# Patient Record
Sex: Female | Born: 1995 | Race: Black or African American | Hispanic: No | Smoking: Never smoker
Health system: Southern US, Community
[De-identification: ages and names within clinical notes are randomized; demographics above are authoritative.]

## PROBLEM LIST (undated history)

## (undated) DIAGNOSIS — I10 Essential (primary) hypertension: Secondary | ICD-10-CM

## (undated) DIAGNOSIS — T8859XA Other complications of anesthesia, initial encounter: Secondary | ICD-10-CM

## (undated) DIAGNOSIS — IMO0002 Reserved for concepts with insufficient information to code with codable children: Secondary | ICD-10-CM

## (undated) DIAGNOSIS — F32A Depression, unspecified: Secondary | ICD-10-CM

## (undated) DIAGNOSIS — R519 Headache, unspecified: Secondary | ICD-10-CM

## (undated) DIAGNOSIS — F419 Anxiety disorder, unspecified: Secondary | ICD-10-CM

## (undated) HISTORY — DX: Other complications of anesthesia, initial encounter: T88.59XA

## (undated) HISTORY — DX: Reserved for concepts with insufficient information to code with codable children: IMO0002

## (undated) HISTORY — PX: OTHER SURGICAL HISTORY: SHX169

---
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
## 2017-03-14 ENCOUNTER — Other Ambulatory Visit: Payer: Self-pay | Admitting: Obstetrics and Gynecology

## 2018-03-13 ENCOUNTER — Other Ambulatory Visit: Payer: Self-pay | Admitting: Obstetrics and Gynecology

## 2018-03-14 ENCOUNTER — Other Ambulatory Visit: Payer: Self-pay

## 2018-03-14 DIAGNOSIS — IMO0001 Reserved for inherently not codable concepts without codable children: Secondary | ICD-10-CM

## 2018-03-14 DIAGNOSIS — Z30011 Encounter for initial prescription of contraceptive pills: Secondary | ICD-10-CM

## 2018-03-14 MED ORDER — NORETHIN ACE-ETH ESTRAD-FE 1.5-30 MG-MCG PO TABS: 1.0000 | ORAL_TABLET | Freq: Every day | ORAL | 0 refills | Status: DC

## 2018-03-14 NOTE — Progress Notes (Signed)
Rx sent w/ no refills.   Pt advised to keep Annual Appt to get additional refills.  Pt voiced understanding.

## 2018-03-25 ENCOUNTER — Ambulatory Visit: Payer: Self-pay | Admitting: Family Medicine

## 2018-04-09 ENCOUNTER — Other Ambulatory Visit: Payer: Self-pay | Admitting: Obstetrics

## 2018-04-09 DIAGNOSIS — IMO0001 Reserved for inherently not codable concepts without codable children: Secondary | ICD-10-CM

## 2018-04-09 NOTE — Telephone Encounter (Signed)
Please review for refill.  

## 2018-04-21 ENCOUNTER — Other Ambulatory Visit (HOSPITAL_COMMUNITY)
Admission: RE | Admit: 2018-04-21 | Discharge: 2018-04-21 | Disposition: A | Discharge status: Home / Self Care | Payer: Medicaid Other | Source: Ambulatory Visit | Attending: Family Medicine | Admitting: Family Medicine

## 2018-04-21 ENCOUNTER — Ambulatory Visit (INDEPENDENT_AMBULATORY_CARE_PROVIDER_SITE_OTHER): Payer: Medicaid Other

## 2018-04-21 VITALS — BP 137/87 | HR 82 | Ht 65.0 in | Wt 149.8 lb

## 2018-04-21 DIAGNOSIS — IMO0001 Reserved for inherently not codable concepts without codable children: Secondary | ICD-10-CM

## 2018-04-21 DIAGNOSIS — N760 Acute vaginitis: Secondary | ICD-10-CM | POA: Diagnosis not present

## 2018-04-21 DIAGNOSIS — Z0000 Encounter for general adult medical examination without abnormal findings: Secondary | ICD-10-CM | POA: Diagnosis not present

## 2018-04-21 DIAGNOSIS — Z01419 Encounter for gynecological examination (general) (routine) without abnormal findings: Secondary | ICD-10-CM

## 2018-04-21 DIAGNOSIS — B373 Candidiasis of vulva and vagina: Secondary | ICD-10-CM | POA: Insufficient documentation

## 2018-04-21 MED ORDER — NORETHIN ACE-ETH ESTRAD-FE 1.5-30 MG-MCG PO TABS: 1.0000 | ORAL_TABLET | Freq: Every day | ORAL | 11 refills | Status: DC

## 2018-04-21 MED ORDER — FLUCONAZOLE 150 MG PO TABS: 150.0000 mg | ORAL_TABLET | Freq: Once | ORAL | 0 refills | Status: AC

## 2018-04-21 NOTE — Progress Notes (Signed)
Pt is here for annual gyn exam. G0. Currently sexually active, contraception: Junel FE. LMP: 04/09/18 approx. Pt states she thinks she might have a yeast infection, c/o discharge, itching and odor. Pt states this has been happening for a couple months, notices that it starts before her period. Pt has tried natural home remedies for these symptoms.
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
## 2018-04-22 LAB — CERVICOVAGINAL ANCILLARY ONLY
Bacterial vaginitis: NEGATIVE
Candida vaginitis: POSITIVE — AB
Chlamydia: POSITIVE — AB
Neisseria Gonorrhea: NEGATIVE
Trichomonas: NEGATIVE

## 2018-04-25 ENCOUNTER — Telehealth: Payer: Self-pay

## 2018-04-25 DIAGNOSIS — A749 Chlamydial infection, unspecified: Secondary | ICD-10-CM

## 2018-04-25 MED ORDER — AZITHROMYCIN 250 MG PO TABS: 1000.0000 mg | ORAL_TABLET | Freq: Once | ORAL | 0 refills | Status: AC

## 2018-04-25 NOTE — Telephone Encounter (Signed)
Attempted to call patient regarding positive chlamydia results. Left message requesting call back. Will send RX to pharmacy and send patient a myChart message.  Rolm Bookbinder, CNM 04/25/18 1:07 PM

## 2018-04-25 NOTE — Telephone Encounter (Signed)
Patient informed of diagnosis and RX at pharmacy. Recommended partner treatment at the [facility] department and abstaining from intercourse for 2 weeks. Patient verbalized understanding.  Rolm Bookbinder, CNM 04/25/18 1:14 PM

## 2018-08-29 ENCOUNTER — Ambulatory Visit: Payer: Medicaid Other | Admitting: Medical

## 2018-09-05 ENCOUNTER — Encounter: Payer: Self-pay | Admitting: Medical

## 2018-09-05 ENCOUNTER — Ambulatory Visit (INDEPENDENT_AMBULATORY_CARE_PROVIDER_SITE_OTHER): Payer: Medicaid Other | Admitting: Medical

## 2018-09-05 ENCOUNTER — Other Ambulatory Visit (HOSPITAL_COMMUNITY)
Admission: RE | Admit: 2018-09-05 | Discharge: 2018-09-05 | Disposition: A | Discharge status: Home / Self Care | Payer: Medicaid Other | Source: Ambulatory Visit | Attending: Medical | Admitting: Medical

## 2018-09-05 VITALS — BP 140/90 | HR 80 | Wt 156.0 lb

## 2018-09-05 DIAGNOSIS — N898 Other specified noninflammatory disorders of vagina: Secondary | ICD-10-CM | POA: Diagnosis not present

## 2018-09-05 DIAGNOSIS — B373 Candidiasis of vulva and vagina: Secondary | ICD-10-CM

## 2018-09-05 DIAGNOSIS — B3731 Acute candidiasis of vulva and vagina: Secondary | ICD-10-CM

## 2018-09-05 DIAGNOSIS — N760 Acute vaginitis: Secondary | ICD-10-CM

## 2018-09-05 DIAGNOSIS — B9689 Other specified bacterial agents as the cause of diseases classified elsewhere: Secondary | ICD-10-CM | POA: Diagnosis not present

## 2018-09-05 NOTE — Patient Instructions (Signed)
Vaginitis    Vaginitis is irritation and swelling (inflammation) of the vagina. It happens when normal bacteria and yeast in the vagina grow too much. There are many types of this condition. Treatment will depend on the type you have.  Follow these instructions at home:  Lifestyle  · Keep your vagina area clean and dry.  ? Avoid using soap.  ? Rinse the area with water.  · Do not do the following until your doctor says it is okay:  ? Wash and clean out the vagina (douche).  ? Use tampons.  ? Have sex.  · Wipe from front to back after going to the bathroom.  · Let air reach your vagina.  ? Wear cotton underwear.  ? Do not wear:  ? Underwear while you sleep.  ? Tight pants.  ? Thong underwear.  ? Underwear or nylons without a cotton panel.  ? Take off any wet clothing, such as bathing suits, as soon as possible.  · Use gentle, non-scented products. Do not use things that can irritate the vagina, such as fabric softeners. Avoid the following products if they are scented:  ? Feminine sprays.  ? Detergents.  ? Tampons.  ? Feminine hygiene products.  ? Soaps or bubble baths.  · Practice safe sex and use condoms.  General instructions  · Take over-the-counter and prescription medicines only as told by your doctor.  · If you were prescribed an antibiotic medicine, take or use it as told by your doctor. Do not stop taking or using the antibiotic even if you start to feel better.  · Keep all follow-up visits as told by your doctor. This is important.  Contact a doctor if:  · You have pain in your belly.  · You have a fever.  · Your symptoms last for more than 2-3 days.  Get help right away if:  · You have a fever and your symptoms get worse all of a sudden.  Summary  · Vaginitis is irritation and swelling of the vagina. It can happen when the normal bacteria and yeast in the vagina grow too much. There are many types.  · Treatment will depend on the type you have.  · Do not douche, use tampons , or have sex until your health  care provider approves. When you can return to sex, practice safe sex and use condoms.  This information is not intended to replace advice given to you by your health care provider. Make sure you discuss any questions you have with your health care provider.  Document Released: 10/12/2008 Document Revised: 08/07/2016 Document Reviewed: 08/07/2016  Elsevier Interactive Patient Education © 2019 [employer]

## 2018-09-05 NOTE — Progress Notes (Signed)
  History:  [name] is a 23 y.o. G0P0000 who presents to clinic today for vaginal odor x 2 months. She is on OCPs for birth control and uses condoms sometimes. She was seen for this in the past and had Chlamydia which was treated at her last visit. She denies abdominal pain, vaginal bleeding, abnormal discharge, fever today. She missed a couple of periods recently but had a period about a week ago. She denies missing any OCPs recently.    The following portions of the patient's history were reviewed and updated as appropriate: allergies, current medications, family history, past medical history, social history, past surgical history and problem list.  Review of Systems:  Review of Systems  Constitutional: Negative for fever.  Gastrointestinal: Positive for nausea and vomiting. Negative for abdominal pain.  Genitourinary:       Neg - discharge, bleeding      Objective:  Physical Exam BP 140/90   Pulse 80   Wt 156 lb (70.8 kg)   LMP 08/22/2018   BMI 25.96 kg/m  Physical Exam  Nursing note and vitals reviewed. Constitutional: She is oriented to person, place, and time. She appears well-developed and well-nourished. No distress.  HENT:  Head: Normocephalic and atraumatic.  Cardiovascular: Normal rate.  Respiratory: Effort normal.  GI: Soft. She exhibits no distension and no mass. There is no abdominal tenderness. There is no rebound and no guarding.  Genitourinary: Uterus is not enlarged and not tender. Cervix exhibits no motion tenderness, no discharge and no friability. Right adnexum displays no mass and no tenderness. Left adnexum displays no mass and no tenderness.    Vaginal discharge (scant white) present.     No vaginal bleeding.  No bleeding in the vagina.  Neurological: She is alert and oriented to person, place, and time.  Skin: Skin is warm and dry. No erythema.  Psychiatric: She has a normal mood and affect.     Assessment & Plan:  1. Vaginal odor -  Cervicovaginal ancillary only( )  2. Irregular periods - UPT today - negative   Marny Lowenstein, PA-C 09/05/2018 9:11 AM

## 2018-09-07 LAB — CERVICOVAGINAL ANCILLARY ONLY
Bacterial vaginitis: POSITIVE — AB
Candida vaginitis: POSITIVE — AB
Chlamydia: NEGATIVE
NEISSERIA GONORRHEA: NEGATIVE
Trichomonas: NEGATIVE

## 2018-09-08 MED ORDER — METRONIDAZOLE 500 MG PO TABS: 500.0000 mg | ORAL_TABLET | Freq: Two times a day (BID) | ORAL | 0 refills | Status: DC

## 2018-09-08 MED ORDER — FLUCONAZOLE 150 MG PO TABS: 150.0000 mg | ORAL_TABLET | Freq: Once | ORAL | 0 refills | Status: AC

## 2018-09-08 NOTE — Addendum Note (Signed)
Addended by: Marny Lowenstein on: 09/08/2018 12:54 PM   Modules accepted: Orders

## 2018-10-13 ENCOUNTER — Other Ambulatory Visit: Payer: Self-pay

## 2018-10-13 ENCOUNTER — Emergency Department (HOSPITAL_COMMUNITY)
Admission: EM | Admit: 2018-10-13 | Discharge: 2018-10-14 | Disposition: A | Discharge status: Home / Self Care | Payer: Medicaid Other | Attending: Emergency Medicine | Admitting: Emergency Medicine

## 2018-10-13 DIAGNOSIS — R5383 Other fatigue: Secondary | ICD-10-CM | POA: Diagnosis not present

## 2018-10-13 DIAGNOSIS — R0602 Shortness of breath: Secondary | ICD-10-CM | POA: Insufficient documentation

## 2018-10-13 DIAGNOSIS — I10 Essential (primary) hypertension: Secondary | ICD-10-CM | POA: Diagnosis not present

## 2018-10-13 DIAGNOSIS — Z79899 Other long term (current) drug therapy: Secondary | ICD-10-CM | POA: Diagnosis not present

## 2018-10-13 NOTE — ED Triage Notes (Signed)
Pt reports she was at work earlier and felt SOB and her eyes "felt heavy" Pt has no complaints at this time. No sick contacts, no recent travel, no fever.
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
## 2018-11-25 ENCOUNTER — Other Ambulatory Visit: Payer: Self-pay

## 2018-11-25 ENCOUNTER — Encounter (HOSPITAL_COMMUNITY): Payer: Self-pay | Admitting: *Deleted

## 2018-11-25 ENCOUNTER — Emergency Department (HOSPITAL_COMMUNITY)
Admission: EM | Admit: 2018-11-25 | Discharge: 2018-11-26 | Disposition: A | Discharge status: Home / Self Care | Payer: Medicaid Other | Attending: Emergency Medicine | Admitting: Emergency Medicine

## 2018-11-25 DIAGNOSIS — S6992XA Unspecified injury of left wrist, hand and finger(s), initial encounter: Secondary | ICD-10-CM | POA: Diagnosis present

## 2018-11-25 DIAGNOSIS — T23222A Burn of second degree of single left finger (nail) except thumb, initial encounter: Secondary | ICD-10-CM | POA: Diagnosis not present

## 2018-11-25 DIAGNOSIS — Z79899 Other long term (current) drug therapy: Secondary | ICD-10-CM | POA: Insufficient documentation

## 2018-11-25 DIAGNOSIS — T300 Burn of unspecified body region, unspecified degree: Secondary | ICD-10-CM

## 2018-11-25 DIAGNOSIS — X153XXA Contact with hot saucepan or skillet, initial encounter: Secondary | ICD-10-CM | POA: Diagnosis not present

## 2018-11-25 DIAGNOSIS — Y93G9 Activity, other involving cooking and grilling: Secondary | ICD-10-CM | POA: Insufficient documentation

## 2018-11-25 DIAGNOSIS — Y92000 Kitchen of unspecified non-institutional (private) residence as  the place of occurrence of the external cause: Secondary | ICD-10-CM | POA: Insufficient documentation

## 2018-11-25 DIAGNOSIS — I10 Essential (primary) hypertension: Secondary | ICD-10-CM | POA: Insufficient documentation

## 2018-11-25 DIAGNOSIS — Y999 Unspecified external cause status: Secondary | ICD-10-CM | POA: Insufficient documentation

## 2018-11-25 DIAGNOSIS — T23122A Burn of first degree of single left finger (nail) except thumb, initial encounter: Secondary | ICD-10-CM | POA: Diagnosis not present

## 2018-11-25 MED ORDER — SILVER SULFADIAZINE 1 % EX CREA
TOPICAL_CREAM | Freq: Once | CUTANEOUS | Status: AC
  Administered 2018-11-26: via TOPICAL
  Filled 2018-11-25: qty 85
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
## 2018-11-26 NOTE — Discharge Instructions (Signed)
Continue using silvadene cream and dressing changes at least once daily. Monitor for signs of infection--- pus, fever, severe swelling, etc. Return here for new concerns.

## 2019-03-31 ENCOUNTER — Other Ambulatory Visit: Payer: Self-pay

## 2019-03-31 ENCOUNTER — Encounter (HOSPITAL_COMMUNITY): Payer: Self-pay

## 2019-03-31 ENCOUNTER — Ambulatory Visit (HOSPITAL_COMMUNITY)
Admission: EM | Admit: 2019-03-31 | Discharge: 2019-03-31 | Disposition: A | Discharge status: Home / Self Care | Payer: Medicaid Other | Attending: Family Medicine | Admitting: Family Medicine

## 2019-03-31 DIAGNOSIS — M546 Pain in thoracic spine: Secondary | ICD-10-CM

## 2019-03-31 MED ORDER — CYCLOBENZAPRINE HCL 5 MG PO TABS: 5.0000 mg | ORAL_TABLET | Freq: Two times a day (BID) | ORAL | 0 refills | Status: DC | PRN

## 2019-03-31 MED ORDER — IBUPROFEN 800 MG PO TABS: 800.0000 mg | ORAL_TABLET | Freq: Three times a day (TID) | ORAL | 0 refills | Status: DC

## 2019-03-31 NOTE — Discharge Instructions (Signed)
Use anti-inflammatories for pain/swelling. You may take up to 800 mg Ibuprofen every 8 hours with food. You may supplement Ibuprofen with Tylenol [phone] mg every 8 hours.   You may use flexeril as needed to help with pain. This is a muscle relaxer and causes sedation- please use only at bedtime or when you will be home and not have to drive/work  Alternate ice and heat  Follow up if not getting better
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
## 2019-03-31 NOTE — ED Triage Notes (Signed)
Patient presents to Urgent Care with complaints of left sided back/shoulder pain since 4 days ago. Patient reports she was lifting something heavy and thinks she pulled a muscle. Pt states the pain is worse w/ movement or deep breaths.

## 2019-04-09 ENCOUNTER — Other Ambulatory Visit: Payer: Self-pay

## 2019-04-09 MED ORDER — NORETHIN ACE-ETH ESTRAD-FE 1.5-30 MG-MCG PO TABS: 1.0000 | ORAL_TABLET | Freq: Every day | ORAL | 0 refills | Status: DC

## 2019-05-06 ENCOUNTER — Other Ambulatory Visit: Payer: Self-pay | Admitting: Obstetrics

## 2019-05-27 ENCOUNTER — Telehealth: Payer: Self-pay

## 2019-05-27 NOTE — Telephone Encounter (Signed)
Attempted to return call twice, number busy/disconnected.

## 2019-05-28 ENCOUNTER — Telehealth: Payer: Self-pay

## 2019-05-28 ENCOUNTER — Other Ambulatory Visit: Payer: Self-pay | Admitting: Obstetrics

## 2019-05-28 MED ORDER — NORETHIN ACE-ETH ESTRAD-FE 1.5-30 MG-MCG PO TABS: 1.0000 | ORAL_TABLET | Freq: Every day | ORAL | 0 refills | Status: DC

## 2019-05-28 NOTE — Telephone Encounter (Signed)
Attempted to return call again, call will not go thru, sent requested BC refill, annual scheduled for 06-09-19

## 2019-06-09 ENCOUNTER — Other Ambulatory Visit (HOSPITAL_COMMUNITY)
Admission: RE | Admit: 2019-06-09 | Discharge: 2019-06-09 | Disposition: A | Discharge status: Home / Self Care | Payer: Medicaid Other | Source: Ambulatory Visit | Attending: Advanced Practice Midwife | Admitting: Advanced Practice Midwife

## 2019-06-09 ENCOUNTER — Encounter: Payer: Self-pay | Admitting: Advanced Practice Midwife

## 2019-06-09 ENCOUNTER — Ambulatory Visit (INDEPENDENT_AMBULATORY_CARE_PROVIDER_SITE_OTHER): Payer: Medicaid Other | Admitting: Advanced Practice Midwife

## 2019-06-09 ENCOUNTER — Other Ambulatory Visit: Payer: Self-pay

## 2019-06-09 VITALS — BP 128/86 | HR 97 | Ht 65.0 in | Wt 145.0 lb

## 2019-06-09 DIAGNOSIS — Z3009 Encounter for other general counseling and advice on contraception: Secondary | ICD-10-CM

## 2019-06-09 DIAGNOSIS — N898 Other specified noninflammatory disorders of vagina: Secondary | ICD-10-CM | POA: Insufficient documentation

## 2019-06-09 DIAGNOSIS — Z0000 Encounter for general adult medical examination without abnormal findings: Secondary | ICD-10-CM

## 2019-06-09 DIAGNOSIS — Z01419 Encounter for gynecological examination (general) (routine) without abnormal findings: Secondary | ICD-10-CM

## 2019-06-09 DIAGNOSIS — Z3041 Encounter for surveillance of contraceptive pills: Secondary | ICD-10-CM

## 2019-06-09 MED ORDER — NORETHIN ACE-ETH ESTRAD-FE 1.5-30 MG-MCG PO TABS: 1.0000 | ORAL_TABLET | Freq: Every day | ORAL | 4 refills | Status: DC

## 2019-06-09 NOTE — Progress Notes (Signed)
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  She is taking Junel OTC.  C/o of NV on first day of starting a new pack, vaginal discharge with odor x 1 week.  Got the FLU vaccine at work.  GAD-7=9

## 2019-06-09 NOTE — Progress Notes (Signed)
Subjective:     Anita Hoover is a 23 y.o. female here for a routine exam.  Current complaints: vaginal discharge with odor associated with her period and some mood changes she thinks may be related to OCPs.  Personal health questionnaire reviewed: yes.  Do you have a primary care provider? yes How many times per week do you exercise? 3-4 Do you feel safe at home? yes Has anyone hit, slapped, or kicked you recently? no Do you feel sad, tired, or upset most days or are you mostly happy with life? Mostly happy but some recent feelings of anxiety    Gynecologic History Patient's last menstrual period was 06/02/2019 (approximate). Contraception: OCP (estrogen/progesterone) Last Pap: n/a.  Last mammogram: n/a  Obstetric History OB History  Gravida Para Term Preterm AB Living  0 0 0 0 0 0  SAB TAB Ectopic Multiple Live Births  0 0 0 0 0     The following portions of the patient's history were reviewed and updated as appropriate: allergies, current medications, past family history, past medical history, past social history, past surgical history and problem list.  Review of Systems Pertinent items noted in HPI and remainder of comprehensive ROS otherwise negative.    Objective:   BP 128/86 (BP Location: Left Arm, Cuff Size: Normal)   Pulse 97   Ht 5\' 5"  (1.651 m)   Wt 145 lb (65.8 kg)   LMP 06/02/2019 (Approximate)   BMI 24.13 kg/m   VS reviewed, nursing note reviewed,  Constitutional: well developed, well nourished, no distress HEENT: normocephalic, thyroid wnl CV: normal rate Pulm/chest wall: normal effort Breast Exam:  right breast normal without mass, skin or nipple changes or axillary nodes, left breast normal without mass, skin or nipple changes or axillary nodes Abdomen: soft Neuro: alert and oriented x 3 Skin: warm, dry Psych: affect normal Pelvic exam: No SSE, vaginal swab collected by blind swab, vaginal walls and external genitalia normal       Assessment/Plan:   1. Vaginal discharge --Pt reports thin discharge with odor, associated with menses.  Likely BV but will screen for STDs/yeast as well. - Cervicovaginal ancillary only( [facility])  2. Well woman exam with routine gynecological exam --Doing well    3. Encounter for counseling regarding contraception --Discussed LARCs as most effective forms of birth control.  Discussed benefits/risks of other methods.   --Also discussed feelings of anxiety, may or may not be related to OCPs.  Discussed options including trying progesterone only contraception or switching to another brand of OCP.  Pt considering stopping OCPs for short course to see if symptoms improve.  Recommend condom use.   --Pt with borderline BP today, hx of HTN treated with medications but PCP took her off medications as BP normalized.  No barriers to OCPs but pt encouraged to see PCP/check BP while on OCPs. --Pt desires to continue Junel OCPs at this time.  Will call office if desires to change. Pt given written and verbal information about other contraceptive choices.   - norethindrone-ethinyl estradiol-iron (JUNEL FE 1.5/30) 1.5-30 MG-MCG tablet; Take 1 tablet by mouth daily. May change to 28 tablets with 11 refills if 90 day supply not covered by pt insurance  Dispense: 84 tablet; Refill: 4    Follow up in: 1 year or as needed.   Fatima Blank, CNM 9:36 AM

## 2019-06-11 LAB — CERVICOVAGINAL ANCILLARY ONLY
Bacterial Vaginitis (gardnerella): POSITIVE — AB
Candida Glabrata: NEGATIVE
Candida Vaginitis: NEGATIVE
Chlamydia: NEGATIVE
Comment: NEGATIVE
Comment: NEGATIVE
Comment: NEGATIVE
Comment: NEGATIVE
Comment: NEGATIVE
Comment: NORMAL
Neisseria Gonorrhea: NEGATIVE
Trichomonas: NEGATIVE

## 2019-06-16 MED ORDER — METRONIDAZOLE 500 MG PO TABS: 500.0000 mg | ORAL_TABLET | Freq: Two times a day (BID) | ORAL | 0 refills | Status: AC

## 2019-06-16 NOTE — Addendum Note (Signed)
Addended by: Fatima Blank A on: 06/16/2019 02:06 PM   Modules accepted: Orders

## 2019-09-07 ENCOUNTER — Other Ambulatory Visit: Payer: Self-pay | Admitting: *Deleted

## 2019-09-07 DIAGNOSIS — N898 Other specified noninflammatory disorders of vagina: Secondary | ICD-10-CM

## 2019-09-07 MED ORDER — FLUCONAZOLE 150 MG PO TABS: 150.0000 mg | ORAL_TABLET | Freq: Once | ORAL | 0 refills | Status: AC

## 2019-09-07 NOTE — Progress Notes (Signed)
Pt called to office with vaginal itching- yeast inf- ask for tx. Diflucan sent in per protocol. Advised to call office if symptoms do not resolve.

## 2019-11-03 ENCOUNTER — Other Ambulatory Visit: Payer: Self-pay

## 2019-11-03 ENCOUNTER — Encounter (HOSPITAL_COMMUNITY): Payer: Self-pay | Admitting: Emergency Medicine

## 2019-11-03 ENCOUNTER — Emergency Department (HOSPITAL_COMMUNITY)
Admission: EM | Admit: 2019-11-03 | Discharge: 2019-11-03 | Disposition: A | Discharge status: Home / Self Care | Payer: Medicaid Other | Attending: Emergency Medicine | Admitting: Emergency Medicine

## 2019-11-03 DIAGNOSIS — I10 Essential (primary) hypertension: Secondary | ICD-10-CM | POA: Insufficient documentation

## 2019-11-03 DIAGNOSIS — F419 Anxiety disorder, unspecified: Secondary | ICD-10-CM | POA: Insufficient documentation

## 2019-11-03 DIAGNOSIS — R0602 Shortness of breath: Secondary | ICD-10-CM | POA: Diagnosis present

## 2019-11-03 MED ORDER — HYDROXYZINE HCL 25 MG PO TABS: 25.0000 mg | ORAL_TABLET | Freq: Four times a day (QID) | ORAL | 0 refills | Status: DC

## 2019-11-03 NOTE — Discharge Instructions (Signed)
As discussed, I have included outpatient resources for anxiety. I recommend calling today to schedule appointments. I am also sending you home with anxiety medication to take as needed. I have included the [facility] for [facility]. I also recommend calling to schedule an appointment to establish care. Please return to the ER for new or worsening symptoms or if you feel like you want to hurt yourself.

## 2019-11-03 NOTE — ED Triage Notes (Signed)
Pt arrives to ED from work with complaints of an anxiety attack this morning and feeling depressed for over three years. Patient states that within the past year her depression has worsened. No SI/HI thoughts today. Patient states nothing changed today she just feels like she needs help.
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
## 2019-11-07 DIAGNOSIS — F419 Anxiety disorder, unspecified: Secondary | ICD-10-CM | POA: Diagnosis not present

## 2019-11-11 ENCOUNTER — Encounter (HOSPITAL_COMMUNITY): Payer: Self-pay | Admitting: Psychiatry

## 2019-11-11 ENCOUNTER — Ambulatory Visit (INDEPENDENT_AMBULATORY_CARE_PROVIDER_SITE_OTHER): Payer: Medicaid Other | Admitting: Psychiatry

## 2019-11-11 ENCOUNTER — Other Ambulatory Visit: Payer: Self-pay

## 2019-11-11 DIAGNOSIS — F411 Generalized anxiety disorder: Secondary | ICD-10-CM

## 2019-11-11 DIAGNOSIS — F410 Panic disorder [episodic paroxysmal anxiety] without agoraphobia: Secondary | ICD-10-CM

## 2019-11-11 DIAGNOSIS — F330 Major depressive disorder, recurrent, mild: Secondary | ICD-10-CM | POA: Insufficient documentation

## 2019-11-11 DIAGNOSIS — F331 Major depressive disorder, recurrent, moderate: Secondary | ICD-10-CM | POA: Diagnosis not present

## 2019-11-11 MED ORDER — FLUOXETINE HCL 20 MG PO CAPS: 20.0000 mg | ORAL_CAPSULE | Freq: Every day | ORAL | 0 refills | Status: DC

## 2019-11-11 MED ORDER — TRAZODONE HCL 50 MG PO TABS: 50.0000 mg | ORAL_TABLET | Freq: Every evening | ORAL | 0 refills | Status: DC | PRN

## 2019-11-11 MED ORDER — LORAZEPAM 1 MG PO TABS: 1.0000 mg | ORAL_TABLET | Freq: Every day | ORAL | 0 refills | Status: DC | PRN
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
## 2019-12-09 ENCOUNTER — Telehealth (HOSPITAL_COMMUNITY): Payer: Medicaid Other | Admitting: Psychiatry

## 2019-12-11 ENCOUNTER — Telehealth (INDEPENDENT_AMBULATORY_CARE_PROVIDER_SITE_OTHER): Payer: Medicaid Other | Admitting: Psychiatry

## 2019-12-11 ENCOUNTER — Other Ambulatory Visit: Payer: Self-pay

## 2019-12-11 DIAGNOSIS — F331 Major depressive disorder, recurrent, moderate: Secondary | ICD-10-CM

## 2019-12-11 DIAGNOSIS — F411 Generalized anxiety disorder: Secondary | ICD-10-CM

## 2019-12-11 DIAGNOSIS — F410 Panic disorder [episodic paroxysmal anxiety] without agoraphobia: Secondary | ICD-10-CM | POA: Diagnosis not present

## 2019-12-11 MED ORDER — LORAZEPAM 1 MG PO TABS: 1.0000 mg | ORAL_TABLET | Freq: Every day | ORAL | 0 refills | Status: AC | PRN

## 2019-12-11 MED ORDER — FLUOXETINE HCL 20 MG PO CAPS: 20.0000 mg | ORAL_CAPSULE | Freq: Every day | ORAL | 0 refills | Status: DC
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
## 2020-01-28 ENCOUNTER — Telehealth (HOSPITAL_COMMUNITY): Payer: Medicaid Other | Admitting: Psychiatry

## 2020-02-03 ENCOUNTER — Other Ambulatory Visit: Payer: Self-pay

## 2020-02-03 ENCOUNTER — Telehealth (INDEPENDENT_AMBULATORY_CARE_PROVIDER_SITE_OTHER): Payer: Medicaid Other | Admitting: Psychiatry

## 2020-02-03 DIAGNOSIS — F410 Panic disorder [episodic paroxysmal anxiety] without agoraphobia: Secondary | ICD-10-CM

## 2020-02-03 MED ORDER — LORAZEPAM 1 MG PO TABS: 1.0000 mg | ORAL_TABLET | Freq: Every day | ORAL | 2 refills | Status: DC | PRN
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
## 2020-02-12 ENCOUNTER — Ambulatory Visit: Payer: Medicaid Other

## 2020-02-16 ENCOUNTER — Ambulatory Visit: Payer: Medicaid Other | Admitting: Obstetrics

## 2020-04-11 ENCOUNTER — Telehealth (INDEPENDENT_AMBULATORY_CARE_PROVIDER_SITE_OTHER): Payer: Medicaid Other | Admitting: Psychiatry

## 2020-04-11 ENCOUNTER — Other Ambulatory Visit: Payer: Self-pay

## 2020-04-11 DIAGNOSIS — F410 Panic disorder [episodic paroxysmal anxiety] without agoraphobia: Secondary | ICD-10-CM | POA: Diagnosis not present

## 2020-04-11 DIAGNOSIS — F330 Major depressive disorder, recurrent, mild: Secondary | ICD-10-CM | POA: Diagnosis not present

## 2020-04-11 DIAGNOSIS — F411 Generalized anxiety disorder: Secondary | ICD-10-CM | POA: Diagnosis not present

## 2020-04-11 MED ORDER — CLONAZEPAM 0.5 MG PO TABS: 0.5000 mg | ORAL_TABLET | Freq: Three times a day (TID) | ORAL | 0 refills | Status: DC | PRN
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
## 2020-04-19 DIAGNOSIS — Z114 Encounter for screening for human immunodeficiency virus [HIV]: Secondary | ICD-10-CM | POA: Diagnosis not present

## 2020-04-19 DIAGNOSIS — Z0000 Encounter for general adult medical examination without abnormal findings: Secondary | ICD-10-CM | POA: Diagnosis not present

## 2020-04-19 DIAGNOSIS — R87610 Atypical squamous cells of undetermined significance on cytologic smear of cervix (ASC-US): Secondary | ICD-10-CM | POA: Diagnosis not present

## 2020-04-19 DIAGNOSIS — Z13220 Encounter for screening for lipoid disorders: Secondary | ICD-10-CM | POA: Diagnosis not present

## 2020-04-19 DIAGNOSIS — R0602 Shortness of breath: Secondary | ICD-10-CM | POA: Diagnosis not present

## 2020-04-19 DIAGNOSIS — Z20822 Contact with and (suspected) exposure to covid-19: Secondary | ICD-10-CM | POA: Diagnosis not present

## 2020-04-19 DIAGNOSIS — E559 Vitamin D deficiency, unspecified: Secondary | ICD-10-CM | POA: Diagnosis not present

## 2020-04-19 DIAGNOSIS — N915 Oligomenorrhea, unspecified: Secondary | ICD-10-CM | POA: Diagnosis not present

## 2020-04-19 DIAGNOSIS — Z13228 Encounter for screening for other metabolic disorders: Secondary | ICD-10-CM | POA: Diagnosis not present

## 2020-04-19 DIAGNOSIS — Z131 Encounter for screening for diabetes mellitus: Secondary | ICD-10-CM | POA: Diagnosis not present

## 2020-04-19 DIAGNOSIS — Z1159 Encounter for screening for other viral diseases: Secondary | ICD-10-CM | POA: Diagnosis not present

## 2020-04-19 DIAGNOSIS — Z7251 High risk heterosexual behavior: Secondary | ICD-10-CM | POA: Diagnosis not present

## 2020-04-19 DIAGNOSIS — R634 Abnormal weight loss: Secondary | ICD-10-CM | POA: Diagnosis not present

## 2020-04-20 ENCOUNTER — Telehealth: Payer: Self-pay

## 2020-04-20 NOTE — Telephone Encounter (Signed)
Pt called and reports that she wants to stop taking Junel birth control pills. Pt wanted to know if she needed to do anything specific. I advised pt to wait until the end of the pack to discontinue so she can keep her menstrual cycle on track. Pt voices understanding. Advised patient to use condoms or other form of birth control right away after stopping pills unless she wants to become pregnant, pt voices understanding.

## 2020-04-26 DIAGNOSIS — Z0000 Encounter for general adult medical examination without abnormal findings: Secondary | ICD-10-CM | POA: Diagnosis not present

## 2020-04-26 DIAGNOSIS — Z1159 Encounter for screening for other viral diseases: Secondary | ICD-10-CM | POA: Diagnosis not present

## 2020-04-26 DIAGNOSIS — Z7251 High risk heterosexual behavior: Secondary | ICD-10-CM | POA: Diagnosis not present

## 2020-05-04 ENCOUNTER — Other Ambulatory Visit: Payer: Self-pay

## 2020-05-04 ENCOUNTER — Telehealth (INDEPENDENT_AMBULATORY_CARE_PROVIDER_SITE_OTHER): Payer: Medicaid Other | Admitting: Psychiatry

## 2020-05-04 DIAGNOSIS — Z3041 Encounter for surveillance of contraceptive pills: Secondary | ICD-10-CM | POA: Diagnosis not present

## 2020-05-04 DIAGNOSIS — F411 Generalized anxiety disorder: Secondary | ICD-10-CM | POA: Diagnosis not present

## 2020-05-04 MED ORDER — TRAZODONE HCL 50 MG PO TABS: 50.0000 mg | ORAL_TABLET | Freq: Every evening | ORAL | 1 refills | Status: DC | PRN

## 2020-05-04 MED ORDER — FLUOXETINE HCL 20 MG PO CAPS: 20.0000 mg | ORAL_CAPSULE | Freq: Every day | ORAL | 2 refills | Status: DC

## 2020-05-04 MED ORDER — CLONAZEPAM 0.5 MG PO TABS: 0.5000 mg | ORAL_TABLET | Freq: Three times a day (TID) | ORAL | 1 refills | Status: DC | PRN
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
## 2020-05-06 DIAGNOSIS — Z3009 Encounter for other general counseling and advice on contraception: Secondary | ICD-10-CM | POA: Diagnosis not present

## 2020-05-06 DIAGNOSIS — R0602 Shortness of breath: Secondary | ICD-10-CM | POA: Diagnosis not present

## 2020-05-06 DIAGNOSIS — E559 Vitamin D deficiency, unspecified: Secondary | ICD-10-CM | POA: Diagnosis not present

## 2020-05-06 DIAGNOSIS — R634 Abnormal weight loss: Secondary | ICD-10-CM | POA: Diagnosis not present

## 2020-06-06 DIAGNOSIS — U071 COVID-19: Secondary | ICD-10-CM | POA: Diagnosis not present

## 2020-06-09 DIAGNOSIS — U071 COVID-19: Secondary | ICD-10-CM | POA: Diagnosis not present

## 2020-06-09 DIAGNOSIS — Z9189 Other specified personal risk factors, not elsewhere classified: Secondary | ICD-10-CM | POA: Diagnosis not present

## 2020-06-09 DIAGNOSIS — R110 Nausea: Secondary | ICD-10-CM | POA: Diagnosis not present

## 2020-06-13 DIAGNOSIS — U071 COVID-19: Secondary | ICD-10-CM | POA: Diagnosis not present

## 2020-06-13 DIAGNOSIS — Z9189 Other specified personal risk factors, not elsewhere classified: Secondary | ICD-10-CM | POA: Diagnosis not present

## 2020-06-14 ENCOUNTER — Emergency Department (HOSPITAL_COMMUNITY)
Admission: EM | Admit: 2020-06-14 | Discharge: 2020-06-14 | Disposition: A | Discharge status: Home / Self Care | Payer: Medicaid Other | Attending: Emergency Medicine | Admitting: Emergency Medicine

## 2020-06-14 ENCOUNTER — Encounter (HOSPITAL_COMMUNITY): Payer: Self-pay

## 2020-06-14 ENCOUNTER — Other Ambulatory Visit: Payer: Self-pay

## 2020-06-14 DIAGNOSIS — I10 Essential (primary) hypertension: Secondary | ICD-10-CM | POA: Diagnosis not present

## 2020-06-14 DIAGNOSIS — R112 Nausea with vomiting, unspecified: Secondary | ICD-10-CM

## 2020-06-14 DIAGNOSIS — U071 COVID-19: Secondary | ICD-10-CM | POA: Insufficient documentation

## 2020-06-14 DIAGNOSIS — R0981 Nasal congestion: Secondary | ICD-10-CM | POA: Diagnosis present

## 2020-06-14 LAB — COMPREHENSIVE METABOLIC PANEL
ALT: 28 U/L (ref 0–44)
AST: 23 U/L (ref 15–41)
Albumin: 4.3 g/dL (ref 3.5–5.0)
Alkaline Phosphatase: 50 U/L (ref 38–126)
Anion gap: 8 (ref 5–15)
BUN: 10 mg/dL (ref 6–20)
CO2: 28 mmol/L (ref 22–32)
Calcium: 9.4 mg/dL (ref 8.9–10.3)
Chloride: 105 mmol/L (ref 98–111)
Creatinine, Ser: 0.69 mg/dL (ref 0.44–1.00)
GFR, Estimated: >60 mL/min (ref >60)
Glucose, Bld: 99 mg/dL (ref 70–99)
Potassium: 4 mmol/L (ref 3.5–5.1)
Sodium: 141 mmol/L (ref 135–145)
Total Bilirubin: 0.6 mg/dL (ref 0.3–1.2)
Total Protein: 7.3 g/dL (ref 6.5–8.1)

## 2020-06-14 LAB — CBC
HCT: 42.9 % (ref 36.0–46.0)
Hemoglobin: 14.1 g/dL (ref 12.0–15.0)
MCH: 30.5 pg (ref 26.0–34.0)
MCHC: 32.9 g/dL (ref 30.0–36.0)
MCV: 92.7 fL (ref 80.0–100.0)
Platelets: 328 10*3/uL (ref 150–400)
RBC: 4.63 MIL/uL (ref 3.87–5.11)
RDW: 12.6 % (ref 11.5–15.5)
WBC: 6.4 10*3/uL (ref 4.0–10.5)
nRBC: 0 % (ref 0.0–0.2)

## 2020-06-14 LAB — RESPIRATORY PANEL BY RT PCR (FLU A&B, COVID)
Influenza A by PCR: NEGATIVE
Influenza B by PCR: NEGATIVE
SARS Coronavirus 2 by RT PCR: POSITIVE — AB

## 2020-06-14 LAB — I-STAT BETA HCG BLOOD, ED (MC, WL, AP ONLY): I-stat hCG, quantitative: <5 m[IU]/mL (ref <5)

## 2020-06-14 LAB — LIPASE, BLOOD: Lipase: 32 U/L (ref 11–51)

## 2020-06-14 NOTE — ED Triage Notes (Signed)
Pt dx with covid 1-2 wks ago. Since then pt has had difficulty eating and drinking due to N/V, and some dizziness. Pt prescribed nausea medication, which helped little. Pt was able to eat last night and keep it down. Today pt had a telehealth appt with PCP, during which the provider recommended pt come to the emergency department to have her blood work checked due to the ongoing N/V.

## 2020-06-14 NOTE — Discharge Instructions (Addendum)
Continue frequent small sips (10-20 ml) of clear liquids every 5-10 minutes. Gatorade or powerade are good options. Avoid milk, orange juice, and grape juice for now. . Once you have not had further vomiting with the small sips for 4 hours, you may begin to drink larger volumes of fluids at a time and try a bland diet which may include saltine crackers, applesauce, breads, pastas, bananas, bland chicken. If you continues to vomit despite medication, return to the ED for repeat evaluation. Otherwise, follow up with your doctor in 2-3 days for a re-check.   Get help right away if: You have pain in your chest, neck, arm, or jaw. You feel extremely weak or you faint. You have persistent vomiting. You have vomit that is bright red or looks like black coffee grounds. You have bloody or black stools or stools that look like tar. You have a severe headache, a stiff neck, or both. You have severe pain, cramping, or bloating in your abdomen. You have difficulty breathing, or you are breathing very quickly. Your heart is beating very quickly. Your skin feels cold and clammy. You feel confused. You have signs of dehydration, such as: Dark urine, very little urine, or no urine. Cracked lips. Dry mouth. Sunken eyes. Sleepiness. Weakness.
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
## 2020-06-15 ENCOUNTER — Telehealth: Payer: Self-pay | Admitting: Nurse Practitioner

## 2020-06-15 NOTE — Telephone Encounter (Signed)
Called to Discuss with patient about Covid symptoms and the use of the monoclonal antibody infusion for those with mild to moderate Covid symptoms and at a high risk of hospitalization.     Pt does not qualify for this infusion due to symptom onset > 10 days and + test on 06/03/20. She is out of the eligible window.   Willette Alma, NP [facility]  [phone]

## 2020-06-20 DIAGNOSIS — Z9189 Other specified personal risk factors, not elsewhere classified: Secondary | ICD-10-CM | POA: Diagnosis not present

## 2020-06-20 DIAGNOSIS — U071 COVID-19: Secondary | ICD-10-CM | POA: Diagnosis not present

## 2020-06-30 ENCOUNTER — Ambulatory Visit (INDEPENDENT_AMBULATORY_CARE_PROVIDER_SITE_OTHER): Payer: Medicaid Other

## 2020-06-30 ENCOUNTER — Other Ambulatory Visit: Payer: Self-pay

## 2020-06-30 DIAGNOSIS — Z3200 Encounter for pregnancy test, result unknown: Secondary | ICD-10-CM

## 2020-06-30 DIAGNOSIS — Z3400 Encounter for supervision of normal first pregnancy, unspecified trimester: Secondary | ICD-10-CM

## 2020-06-30 LAB — POCT URINE PREGNANCY: Preg Test, Ur: POSITIVE — AB

## 2020-06-30 MED ORDER — VITAFOL ULTRA 29-0.6-0.4-200 MG PO CAPS: 1.0000 | ORAL_CAPSULE | Freq: Every day | ORAL | 12 refills | Status: DC

## 2020-06-30 MED ORDER — BLOOD PRESSURE KIT DEVI: 1.0000 | 0 refills | Status: DC | PRN

## 2020-06-30 NOTE — Progress Notes (Signed)
Patient was assessed and managed by nursing staff during this encounter. I have reviewed the chart and agree with the documentation and plan. I have also made any necessary editorial changes.  Warden Fillers, MD 06/30/2020 9:27 AM

## 2020-06-30 NOTE — Progress Notes (Signed)
..   [facility] presents today for UPT. She has no unusual complaints. LMP:05-24-20    OBJECTIVE: Appears well, in no apparent distress.  OB History    Gravida  1   Para  0   Term  0   Preterm  0   AB  0   Living  0     SAB  0   TAB  0   Ectopic  0   Multiple  0   Live Births  0          Home UPT Result:Positive In-Office UPT result:Positive I have reviewed the patient's medical, obstetrical, social, and family histories, and medications.   ASSESSMENT: Positive pregnancy test  PLAN Prenatal care to be completed at: [facility]

## 2020-07-04 ENCOUNTER — Telehealth (HOSPITAL_COMMUNITY): Payer: Medicaid Other | Admitting: Psychiatry

## 2020-07-04 ENCOUNTER — Other Ambulatory Visit: Payer: Self-pay

## 2020-07-11 DIAGNOSIS — Z3491 Encounter for supervision of normal pregnancy, unspecified, first trimester: Secondary | ICD-10-CM | POA: Diagnosis not present

## 2020-07-11 DIAGNOSIS — Z8616 Personal history of COVID-19: Secondary | ICD-10-CM | POA: Diagnosis not present

## 2020-07-21 ENCOUNTER — Telehealth: Payer: Self-pay | Admitting: Certified Nurse Midwife

## 2020-07-21 DIAGNOSIS — Z3400 Encounter for supervision of normal first pregnancy, unspecified trimester: Secondary | ICD-10-CM | POA: Diagnosis not present

## 2020-07-21 MED ORDER — DOXYLAMINE-PYRIDOXINE 10-10 MG PO TBEC: DELAYED_RELEASE_TABLET | ORAL | 2 refills | Status: DC

## 2020-07-21 NOTE — Telephone Encounter (Signed)
Patient called requesting Rx for nausea and vomiting.  She is not able to keep anything down.  She has upcoming New OB appt on 08/11/20.   Diclegis sent to pharmacy per protocol.

## 2020-07-30 NOTE — L&D Delivery Note (Signed)
Delivery Note Pt progressed to C/C/ +2; after about 30 minutes of pushing, at 12:17 AM a viable female was delivered via Vaginal, Spontaneous (Presentation: Middle Occiput Anterior, loose nuchal deliver through)).  APGAR: 6/8 (floppy at first, responded to stimulation), ; weight pending.  After 1 minute, the cord was clamped and cut. 40 units of pitocin diluted in 1000cc LR was infused rapidly IV.  The placenta separated spontaneously and delivered via CCT and maternal pushing effort.  It was inspected and appears to be intact with a 3 VC.   Anesthesia: Epidural Episiotomy: None Lacerations: None Suture Repair:  Est. Blood Loss (mL): 325  Mom to postpartum.  Baby to Couplet care / Skin to Skin.  An[facility] 03/03/2021, 12:55 AM  .vaginal

## 2020-08-02 ENCOUNTER — Ambulatory Visit (INDEPENDENT_AMBULATORY_CARE_PROVIDER_SITE_OTHER): Payer: Medicaid Other

## 2020-08-02 ENCOUNTER — Other Ambulatory Visit: Payer: Self-pay

## 2020-08-02 VITALS — BP 147/98 | HR 108 | Ht 65.0 in | Wt 132.4 lb

## 2020-08-02 DIAGNOSIS — Z789 Other specified health status: Secondary | ICD-10-CM

## 2020-08-02 DIAGNOSIS — O3680X0 Pregnancy with inconclusive fetal viability, not applicable or unspecified: Secondary | ICD-10-CM | POA: Diagnosis not present

## 2020-08-02 DIAGNOSIS — Z3A09 9 weeks gestation of pregnancy: Secondary | ICD-10-CM

## 2020-08-02 DIAGNOSIS — Z3401 Encounter for supervision of normal first pregnancy, first trimester: Secondary | ICD-10-CM

## 2020-08-02 HISTORY — DX: Encounter for supervision of normal first pregnancy, first trimester: Z34.01

## 2020-08-02 NOTE — Progress Notes (Signed)
PRENATAL INTAKE SUMMARY  Ms. Matteo presents today New OB Nurse Interview.  OB History    Gravida  1   Para  0   Term  0   Preterm  0   AB  0   Living  0     SAB  0   IAB  0   Ectopic  0   Multiple  0   Live Births  0          I have reviewed the patient's medical, obstetrical, social, and family histories, medications, and available lab results.  SUBJECTIVE She has no unusual complaints  OBJECTIVE Initial Physical Exam (New OB).  GENERAL APPEARANCE: alert, well appearing   ASSESSMENT Normal pregnancy  PLAN Prenatal care to be completed at [facility] labs to be completed at [facility] provider vist Baby Rx ordered Blood Pressure kit ordered U/S performed today reveals [id number] CRL. FHR 163 PHQ 2 score: 0 GAD 7 score: 2

## 2020-08-11 ENCOUNTER — Encounter: Payer: Self-pay | Admitting: Certified Nurse Midwife

## 2020-08-11 ENCOUNTER — Other Ambulatory Visit: Payer: Self-pay

## 2020-08-11 ENCOUNTER — Ambulatory Visit (INDEPENDENT_AMBULATORY_CARE_PROVIDER_SITE_OTHER): Payer: Medicaid Other | Admitting: Certified Nurse Midwife

## 2020-08-11 ENCOUNTER — Other Ambulatory Visit (HOSPITAL_COMMUNITY)
Admission: RE | Admit: 2020-08-11 | Discharge: 2020-08-11 | Disposition: A | Discharge status: Home / Self Care | Payer: Medicaid Other | Source: Ambulatory Visit | Attending: Certified Nurse Midwife | Admitting: Certified Nurse Midwife

## 2020-08-11 VITALS — BP 126/80 | HR 98 | Wt 135.0 lb

## 2020-08-11 DIAGNOSIS — Z23 Encounter for immunization: Secondary | ICD-10-CM

## 2020-08-11 DIAGNOSIS — Z3401 Encounter for supervision of normal first pregnancy, first trimester: Secondary | ICD-10-CM

## 2020-08-11 DIAGNOSIS — Z3A11 11 weeks gestation of pregnancy: Secondary | ICD-10-CM

## 2020-08-11 DIAGNOSIS — B373 Candidiasis of vulva and vagina: Secondary | ICD-10-CM

## 2020-08-11 DIAGNOSIS — B3731 Acute candidiasis of vulva and vagina: Secondary | ICD-10-CM

## 2020-08-11 NOTE — Patient Instructions (Signed)
Safe Medications in Pregnancy   Acne: Benzoyl Peroxide Salicylic Acid  Backache/Headache: Tylenol: 2 regular strength every 4 hours OR              2 Extra strength every 6 hours  Colds/Coughs/Allergies: Benadryl (alcohol free) 25 mg every 6 hours as needed Breath right strips Claritin Cepacol throat lozenges Chloraseptic throat spray Cold-Eeze- up to three times per day Cough drops, alcohol free Flonase (by prescription only) Guaifenesin Mucinex Robitussin DM (plain only, alcohol free) Saline nasal spray/drops Sudafed (pseudoephedrine) & Actifed ** use only after [redacted] weeks gestation and if you do not have high blood pressure Tylenol Vicks Vaporub Zinc lozenges Zyrtec   Constipation: Colace Ducolax suppositories Fleet enema Glycerin suppositories Metamucil Milk of magnesia Miralax Senokot Smooth move tea  Diarrhea: Kaopectate Imodium A-D  *NO pepto Bismol  Hemorrhoids: Anusol Anusol HC Preparation H Tucks  Indigestion: Tums Maalox Mylanta Zantac  Pepcid  Insomnia: Benadryl (alcohol free) 25mg every 6 hours as needed Tylenol PM Unisom, no Gelcaps  Leg Cramps: Tums MagGel  Nausea/Vomiting:  Bonine Dramamine Emetrol Ginger extract Sea bands Meclizine  Nausea medication to take during pregnancy:  Unisom (doxylamine succinate 25 mg tablets) Take one tablet daily at bedtime. If symptoms are not adequately controlled, the dose can be increased to a maximum recommended dose of two tablets daily (1/2 tablet in the morning, 1/2 tablet mid-afternoon and one at bedtime). Vitamin B6 100mg tablets. Take one tablet twice a day (up to 200 mg per day).  Skin Rashes: Aveeno products Benadryl cream or 25mg every 6 hours as needed Calamine Lotion 1% cortisone cream  Yeast infection: Gyne-lotrimin 7 Monistat 7   **If taking multiple medications, please check labels to avoid duplicating the same active ingredients **take medication as directed on  the label ** Do not exceed 4000 mg of tylenol in 24 hours **Do not take medications that contain aspirin or ibuprofen     https://www.cdc.gov/pregnancy/infections.html">  First Trimester of Pregnancy  The first trimester of pregnancy starts on the first day of your last menstrual period until the end of week 12. This is also called months 1 through 3 of pregnancy. Body changes during your first trimester Your body goes through many changes during pregnancy. The changes usually return to normal after your baby is born. Physical changes  You may gain or lose weight.  Your breasts may grow larger and hurt. The area around your nipples may get darker.  Dark spots or blotches may develop on your face.  You may have changes in your hair. Health changes  You may feel like you might vomit (nauseous), and you may vomit.  You may have heartburn.  You may have headaches.  You may have trouble pooping (constipation).  Your gums may bleed. Other changes  You may get tired easily.  You may pee (urinate) more often.  Your menstrual periods will stop.  You may not feel hungry.  You may want to eat certain kinds of food.  You may have changes in your emotions from day to day.  You may have more dreams. Follow these instructions at home: Medicines  Take over-the-counter and prescription medicines only as told by your doctor. Some medicines are not safe during pregnancy.  Take a prenatal vitamin that contains at least 600 micrograms (mcg) of folic acid. Eating and drinking  Eat healthy meals that include: ? Fresh fruits and vegetables. ? Whole grains. ? Good sources of protein, such as meat, eggs, or tofu. ? Low-fat   dairy products.  Avoid raw meat and unpasteurized juice, milk, and cheese.  If you feel like you may vomit, or you vomit: ? Eat 4 or 5 small meals a day instead of 3 large meals. ? Try eating a few soda crackers. ? Drink liquids between meals instead of  during meals.  You may need to take these actions to prevent or treat trouble pooping: ? Drink enough fluids to keep your pee (urine) pale yellow. ? Eat foods that are high in fiber. These include beans, whole grains, and fresh fruits and vegetables. ? Limit foods that are high in fat and sugar. These include fried or sweet foods. Activity  Exercise only as told by your doctor. Most people can do their usual exercise routine during pregnancy.  Stop exercising if you have cramps or pain in your lower belly (abdomen) or low back.  Do not exercise if it is too hot or too humid, or if you are in a place of great height (high altitude).  Avoid heavy lifting.  If you choose to, you may have sex unless your doctor tells you not to. Relieving pain and discomfort  Wear a good support bra if your breasts are sore.  Rest with your legs raised (elevated) if you have leg cramps or low back pain.  If you have bulging veins (varicose veins) in your legs: ? Wear support hose as told by your doctor. ? Raise your feet for 15 minutes, 3-4 times a day. ? Limit salt in your food. Safety  Wear your seat belt at all times when you are in a car.  Talk with your doctor if someone is hurting you or yelling at you.  Talk with your doctor if you are feeling sad or have thoughts of hurting yourself. Lifestyle  Do not use hot tubs, steam rooms, or saunas.  Do not douche. Do not use tampons or scented sanitary pads.  Do not use herbal medicines, illegal drugs, or medicines that are not approved by your doctor. Do not drink alcohol.  Do not smoke or use any products that contain nicotine or tobacco. If you need help quitting, ask your doctor.  Avoid cat litter boxes and soil that is used by cats. These carry germs that can cause harm to the baby and can cause a loss of your baby by miscarriage or stillbirth. General instructions  Keep all follow-up visits. This is important.  Ask for help if you  need counseling or if you need help with nutrition. Your doctor can give you advice or tell you where to go for help.  Visit your dentist. At home, brush your teeth with a soft toothbrush. Floss gently.  Write down your questions. Take them to your prenatal visits. Where to find more information  American Pregnancy Association: americanpregnancy.org  American College of Obstetricians and Gynecologists: www.acog.org  Office on Women's Health: womenshealth.gov/pregnancy Contact a doctor if:  You are dizzy.  You have a fever.  You have mild cramps or pressure in your lower belly.  You have a nagging pain in your belly area.  You continue to feel like you may vomit, you vomit, or you have watery poop (diarrhea) for 24 hours or longer.  You have a bad-smelling fluid coming from your vagina.  You have pain when you pee.  You are exposed to a disease that spreads from person to person, such as chickenpox, measles, Zika virus, HIV, or hepatitis. Get help right away if:  You have spotting or   bleeding from your vagina.  You have very bad belly cramping or pain.  You have shortness of breath or chest pain.  You have any kind of injury, such as from a fall or a car crash.  You have new or increased pain, swelling, or redness in an arm or leg. Summary  The first trimester of pregnancy starts on the first day of your last menstrual period until the end of week 12 (months 1 through 3).  Eat 4 or 5 small meals a day instead of 3 large meals.  Do not smoke or use any products that contain nicotine or tobacco. If you need help quitting, ask your doctor.  Keep all follow-up visits. This information is not intended to replace advice given to you by your health care provider. Make sure you discuss any questions you have with your health care provider. Document Revised: 12/23/2019 Document Reviewed: 10/29/2019 Elsevier Patient Education  2021 [employer].
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
## 2020-08-11 NOTE — Progress Notes (Addendum)
New OB, reports no problems today. FLU Vaccine given in RD, tolerated well.

## 2020-08-12 LAB — CERVICOVAGINAL ANCILLARY ONLY
Bacterial Vaginitis (gardnerella): NEGATIVE
Candida Glabrata: NEGATIVE
Candida Vaginitis: POSITIVE — AB
Chlamydia: NEGATIVE
Comment: NEGATIVE
Comment: NEGATIVE
Comment: NEGATIVE
Comment: NEGATIVE
Comment: NEGATIVE
Comment: NORMAL
Neisseria Gonorrhea: NEGATIVE
Trichomonas: NEGATIVE

## 2020-08-12 LAB — CBC/D/PLT+RPR+RH+ABO+RUB AB...
Antibody Screen: NEGATIVE
Basophils Absolute: 0 10*3/uL (ref 0.0–0.2)
Basos: 0 %
EOS (ABSOLUTE): 0.5 10*3/uL — ABNORMAL HIGH (ref 0.0–0.4)
Eos: 5 %
HCV Ab: <0.1 s/co ratio (ref 0.0–0.9)
HIV Screen 4th Generation wRfx: NONREACTIVE
Hematocrit: 36.4 % (ref 34.0–46.6)
Hemoglobin: 12.5 g/dL (ref 11.1–15.9)
Hepatitis B Surface Ag: NEGATIVE
Immature Grans (Abs): 0 10*3/uL (ref 0.0–0.1)
Immature Granulocytes: 0 %
Lymphocytes Absolute: 1.6 10*3/uL (ref 0.7–3.1)
Lymphs: 15 %
MCH: 30.3 pg (ref 26.6–33.0)
MCHC: 34.3 g/dL (ref 31.5–35.7)
MCV: 88 fL (ref 79–97)
Monocytes Absolute: 0.6 10*3/uL (ref 0.1–0.9)
Monocytes: 6 %
Neutrophils Absolute: 7.7 10*3/uL — ABNORMAL HIGH (ref 1.4–7.0)
Neutrophils: 74 %
Platelets: 279 10*3/uL (ref 150–450)
RBC: 4.12 x10E6/uL (ref 3.77–5.28)
RDW: 12.7 % (ref 11.7–15.4)
RPR Ser Ql: NONREACTIVE
Rh Factor: NEGATIVE
Rubella Antibodies, IGG: 4.61 index (ref >0.99)
WBC: 10.5 10*3/uL (ref 3.4–10.8)

## 2020-08-12 LAB — HCV INTERPRETATION

## 2020-08-13 LAB — URINE CULTURE, OB REFLEX

## 2020-08-13 LAB — CULTURE, OB URINE

## 2020-08-14 MED ORDER — TERCONAZOLE 0.4 % VA CREA: 1.0000 | TOPICAL_CREAM | Freq: Every day | VAGINAL | 0 refills | Status: DC

## 2020-08-14 NOTE — Addendum Note (Signed)
Addended by: Sharyon Cable on: 08/14/2020 09:47 PM   Modules accepted: Orders

## 2020-08-16 ENCOUNTER — Other Ambulatory Visit: Payer: Self-pay

## 2020-08-16 DIAGNOSIS — Z5321 Procedure and treatment not carried out due to patient leaving prior to being seen by health care provider: Secondary | ICD-10-CM | POA: Insufficient documentation

## 2020-08-16 DIAGNOSIS — R059 Cough, unspecified: Secondary | ICD-10-CM | POA: Insufficient documentation

## 2020-08-16 DIAGNOSIS — R0602 Shortness of breath: Secondary | ICD-10-CM | POA: Insufficient documentation

## 2020-08-16 LAB — CYTOLOGY - PAP
Comment: NEGATIVE
Diagnosis: NEGATIVE
High risk HPV: NEGATIVE

## 2020-08-17 ENCOUNTER — Encounter (HOSPITAL_COMMUNITY): Payer: Self-pay

## 2020-08-17 ENCOUNTER — Emergency Department (HOSPITAL_COMMUNITY): Payer: Medicaid Other

## 2020-08-17 ENCOUNTER — Emergency Department (HOSPITAL_COMMUNITY)
Admission: EM | Admit: 2020-08-17 | Discharge: 2020-08-17 | Disposition: A | Discharge status: Home / Self Care | Payer: Medicaid Other | Attending: Emergency Medicine | Admitting: Emergency Medicine

## 2020-08-17 DIAGNOSIS — R0602 Shortness of breath: Secondary | ICD-10-CM | POA: Diagnosis not present

## 2020-08-17 NOTE — ED Triage Notes (Signed)
Patient arrived stating this afternoon she started feeling short of breath and had a nonproductive cough.

## 2020-08-18 ENCOUNTER — Other Ambulatory Visit: Payer: Self-pay

## 2020-08-18 ENCOUNTER — Other Ambulatory Visit: Payer: Medicaid Other

## 2020-08-29 ENCOUNTER — Encounter: Payer: Self-pay | Admitting: Certified Nurse Midwife

## 2020-09-05 ENCOUNTER — Encounter: Payer: Self-pay | Admitting: Certified Nurse Midwife

## 2020-09-09 ENCOUNTER — Telehealth (INDEPENDENT_AMBULATORY_CARE_PROVIDER_SITE_OTHER): Payer: Medicaid Other | Admitting: Obstetrics and Gynecology

## 2020-09-09 ENCOUNTER — Encounter: Payer: Medicaid Other | Admitting: Obstetrics and Gynecology

## 2020-09-09 VITALS — BP 122/71 | HR 88 | Wt 135.0 lb

## 2020-09-09 DIAGNOSIS — Z3A15 15 weeks gestation of pregnancy: Secondary | ICD-10-CM

## 2020-09-09 DIAGNOSIS — F411 Generalized anxiety disorder: Secondary | ICD-10-CM

## 2020-09-09 DIAGNOSIS — Z3401 Encounter for supervision of normal first pregnancy, first trimester: Secondary | ICD-10-CM

## 2020-09-09 DIAGNOSIS — O99342 Other mental disorders complicating pregnancy, second trimester: Secondary | ICD-10-CM

## 2020-09-09 DIAGNOSIS — O10912 Unspecified pre-existing hypertension complicating pregnancy, second trimester: Secondary | ICD-10-CM

## 2020-09-09 DIAGNOSIS — I10 Essential (primary) hypertension: Secondary | ICD-10-CM

## 2020-09-09 MED ORDER — CVS PRENATAL GUMMY 0.4 MG PO CHEW: 1.0000 | CHEWABLE_TABLET | Freq: Every day | ORAL | 3 refills | Status: DC

## 2020-09-09 MED ORDER — ASPIRIN 81 MG PO TBEC: 81.0000 mg | DELAYED_RELEASE_TABLET | Freq: Every day | ORAL | 12 refills | Status: DC

## 2020-09-09 NOTE — Progress Notes (Signed)
    TELEHEALTH OBSTETRICS VISIT ENCOUNTER NOTE  Provider location: [facility] [employer] [facility] [place]   I connected with Audrie Gallus on 09/09/20 at 10:55 AM EST by telephone at home and verified that I am speaking with the correct person using two identifiers. Of note, unable to do video encounter due to technical difficulties.    I discussed the limitations, risks, security and privacy concerns of performing an evaluation and management service by telephone and the availability of in person appointments. I also discussed with the patient that there may be a patient responsible charge related to this service. The patient expressed understanding and agreed to proceed.  Subjective:  Anita Hoover is a 25 y.o. G1P0000 at [redacted]w[redacted]d being followed for ongoing prenatal care.  She is currently monitored for the following issues for this low-risk pregnancy and has HTN (hypertension); Acne; GAD (generalized anxiety disorder); Panic disorder; and Encounter for supervision of normal first pregnancy in first trimester on their problem list.  Patient reports no complaints. Reports fetal movement. Denies any contractions, bleeding or leaking of fluid.   The following portions of the patient's history were reviewed and updated as appropriate: allergies, current medications, past family history, past medical history, past social history, past surgical history and problem list.   Objective:  Blood pressure 122/71, pulse 88, weight 135 lb (61.2 kg), last menstrual period 05/24/2020. General:  Alert, oriented and cooperative.   Mental Status: Normal mood and affect perceived. Normal judgment and thought content.  Rest of physical exam deferred due to type of encounter  Assessment and Plan:  Pregnancy: G1P0000 at [redacted]w[redacted]d   1. Encounter for supervision of normal first pregnancy in first trimester  [place] MFM scheduled AFP at next visit, discussed today.   2. Hypertension, unspecified type  - BP good  today - not on medications  - RX for BASA   3. GAD (generalized anxiety disorder)  - Ambulatory referral to Integrated Behavioral Health - Doing well   Preterm labor symptoms and general obstetric precautions including but not limited to vaginal bleeding, contractions, leaking of fluid and fetal movement were reviewed in detail with the patient.  I discussed the assessment and treatment plan with the patient. The patient was provided an opportunity to ask questions and all were answered. The patient agreed with the plan and demonstrated an understanding of the instructions. The patient was advised to call back or seek an in-person office evaluation/go to MAU at [facility] for any urgent or concerning symptoms. Please refer to After Visit Summary for other counseling recommendations.   I provided 10 minutes of non-face-to-face time during this encounter.  Return in about 4 weeks (around 10/07/2020), or In person visit for AFP.  Future Appointments  Date Time Provider Department Center  09/09/2020 10:55 AM Yolette Hastings, Harolyn Rutherford, NP [facility] None  09/27/2020 10:45 AM [facility] US5 [facility]    Venia Carbon, NP [facility] [employer], [facility]

## 2020-09-09 NOTE — Progress Notes (Signed)
Virtual visit with no complaints.

## 2020-09-13 ENCOUNTER — Encounter: Payer: Medicaid Other | Admitting: Licensed Clinical Social Worker

## 2020-09-27 ENCOUNTER — Ambulatory Visit: Payer: Medicaid Other | Attending: Certified Nurse Midwife

## 2020-09-27 ENCOUNTER — Other Ambulatory Visit: Payer: Self-pay

## 2020-09-27 ENCOUNTER — Other Ambulatory Visit: Payer: Self-pay | Admitting: Certified Nurse Midwife

## 2020-09-27 ENCOUNTER — Other Ambulatory Visit: Payer: Self-pay | Admitting: Obstetrics and Gynecology

## 2020-09-27 DIAGNOSIS — Z3401 Encounter for supervision of normal first pregnancy, first trimester: Secondary | ICD-10-CM

## 2020-09-27 DIAGNOSIS — O10012 Pre-existing essential hypertension complicating pregnancy, second trimester: Secondary | ICD-10-CM

## 2020-09-27 DIAGNOSIS — Z6791 Unspecified blood type, Rh negative: Secondary | ICD-10-CM

## 2020-10-07 ENCOUNTER — Ambulatory Visit (INDEPENDENT_AMBULATORY_CARE_PROVIDER_SITE_OTHER): Payer: Medicaid Other | Admitting: Obstetrics and Gynecology

## 2020-10-07 ENCOUNTER — Other Ambulatory Visit: Payer: Self-pay

## 2020-10-07 DIAGNOSIS — Z3401 Encounter for supervision of normal first pregnancy, first trimester: Secondary | ICD-10-CM

## 2020-10-07 NOTE — Patient Instructions (Signed)

## 2020-10-07 NOTE — Progress Notes (Signed)
   PRENATAL VISIT NOTE  Subjective:  Anita Hoover is a 25 y.o. G1P0000 at [redacted]w[redacted]d being seen today for ongoing prenatal care.  She is currently monitored for the following issues for this low-risk pregnancy and has HTN (hypertension); Acne; GAD (generalized anxiety disorder); Panic disorder; and Encounter for supervision of normal first pregnancy in first trimester on their problem list.  Patient reports no complaints.  Contractions: Not present. Vag. Bleeding: None.  Movement: Present. Denies leaking of fluid.   The following portions of the patient's history were reviewed and updated as appropriate: allergies, current medications, past family history, past medical history, past social history, past surgical history and problem list.   Objective:   Vitals:   10/07/20 1119  BP: 124/85  Pulse: (!) 123  Weight: 143 lb 3.2 oz (65 kg)    Fetal Status: Fetal Heart Rate (bpm): 141   Movement: Present     General:  Alert, oriented and cooperative. Patient is in no acute distress.  Skin: Skin is warm and dry. No rash noted.   Cardiovascular: Normal heart rate noted  Respiratory: Normal respiratory effort, no problems with respiration noted  Abdomen: Soft, gravid, appropriate for gestational age.  Pain/Pressure: Present     Pelvic: Cervical exam deferred        Extremities: Normal range of motion.  Edema: None  Mental Status: Normal mood and affect. Normal behavior. Normal judgment and thought content.   Assessment and Plan:  Pregnancy: G1P0000 at [redacted]w[redacted]d 1. Encounter for supervision of normal first pregnancy in first trimester  MFM [place] normal. Growth assessment scheduled d/t CHTN  Continue BASA AFP today Round ligament pain- discussed this today. Ok to order/use pregnancy support belt. Call the office if symptoms worsen    Preterm labor symptoms and general obstetric precautions including but not limited to vaginal bleeding, contractions, leaking of fluid and fetal movement were reviewed  in detail with the patient. Please refer to After Visit Summary for other counseling recommendations.   Return in about 4 weeks (around 11/04/2020), or In person or virtual visit is ok.  Future Appointments  Date Time Provider Department Center  11/01/2020 10:30 AM [facility] NURSE [facility]  11/01/2020 10:45 AM [facility] US4 [facility]    Venia Carbon, NP

## 2020-10-07 NOTE — Progress Notes (Signed)
Patient reports fetal movement with occasional pressure. 

## 2020-10-09 LAB — AFP, SERUM, OPEN SPINA BIFIDA
AFP MoM: 0.92
AFP Value: 45.7 ng/mL
Gest. Age on Collection Date: 18 weeks
Maternal Age At EDD: 22.6 yr
OSBR Risk 1 IN: 10000
Test Results:: NEGATIVE
Weight: 143 [lb_av]

## 2020-11-01 ENCOUNTER — Other Ambulatory Visit: Payer: Self-pay | Admitting: *Deleted

## 2020-11-01 ENCOUNTER — Ambulatory Visit: Payer: Medicaid Other | Admitting: *Deleted

## 2020-11-01 ENCOUNTER — Other Ambulatory Visit: Payer: Self-pay

## 2020-11-01 ENCOUNTER — Encounter: Payer: Self-pay | Admitting: *Deleted

## 2020-11-01 ENCOUNTER — Ambulatory Visit: Payer: Medicaid Other | Attending: Obstetrics and Gynecology

## 2020-11-01 DIAGNOSIS — O360120 Maternal care for anti-D [Rh] antibodies, second trimester, not applicable or unspecified: Secondary | ICD-10-CM

## 2020-11-01 DIAGNOSIS — O10012 Pre-existing essential hypertension complicating pregnancy, second trimester: Secondary | ICD-10-CM | POA: Insufficient documentation

## 2020-11-01 DIAGNOSIS — Z6791 Unspecified blood type, Rh negative: Secondary | ICD-10-CM | POA: Insufficient documentation

## 2020-11-01 DIAGNOSIS — Z3401 Encounter for supervision of normal first pregnancy, first trimester: Secondary | ICD-10-CM | POA: Insufficient documentation

## 2020-11-01 DIAGNOSIS — Z3A22 22 weeks gestation of pregnancy: Secondary | ICD-10-CM | POA: Diagnosis not present

## 2020-11-01 DIAGNOSIS — O26899 Other specified pregnancy related conditions, unspecified trimester: Secondary | ICD-10-CM | POA: Insufficient documentation

## 2020-11-01 DIAGNOSIS — O321XX0 Maternal care for breech presentation, not applicable or unspecified: Secondary | ICD-10-CM | POA: Diagnosis not present

## 2020-11-01 DIAGNOSIS — O10919 Unspecified pre-existing hypertension complicating pregnancy, unspecified trimester: Secondary | ICD-10-CM

## 2020-11-04 ENCOUNTER — Ambulatory Visit (INDEPENDENT_AMBULATORY_CARE_PROVIDER_SITE_OTHER): Payer: Medicaid Other | Admitting: Nurse Practitioner

## 2020-11-04 ENCOUNTER — Encounter: Payer: Self-pay | Admitting: Nurse Practitioner

## 2020-11-04 ENCOUNTER — Other Ambulatory Visit: Payer: Self-pay

## 2020-11-04 VITALS — BP 120/73 | HR 80 | Wt 146.0 lb

## 2020-11-04 DIAGNOSIS — F411 Generalized anxiety disorder: Secondary | ICD-10-CM

## 2020-11-04 DIAGNOSIS — F410 Panic disorder [episodic paroxysmal anxiety] without agoraphobia: Secondary | ICD-10-CM

## 2020-11-04 DIAGNOSIS — Z3402 Encounter for supervision of normal first pregnancy, second trimester: Secondary | ICD-10-CM

## 2020-11-04 DIAGNOSIS — Z3A22 22 weeks gestation of pregnancy: Secondary | ICD-10-CM

## 2020-11-04 DIAGNOSIS — Z3401 Encounter for supervision of normal first pregnancy, first trimester: Secondary | ICD-10-CM

## 2020-11-04 NOTE — Progress Notes (Signed)
+   Fetal movement. No complaints.  

## 2020-11-04 NOTE — Progress Notes (Signed)
    Subjective:  Anita Hoover is a 25 y.o. G1P0000 at [redacted]w[redacted]d being seen today for ongoing prenatal care.  She is currently monitored for the following issues for this low-risk pregnancy and has HTN (hypertension); Acne; GAD (generalized anxiety disorder); Panic disorder; and Encounter for supervision of normal first pregnancy in first trimester on their problem list.  Patient reports no complaints.  Contractions: Not present. Vag. Bleeding: None.  Movement: Present. Denies leaking of fluid.   The following portions of the patient's history were reviewed and updated as appropriate: allergies, current medications, past family history, past medical history, past social history, past surgical history and problem list. Problem list updated.  Objective:   Vitals:   11/04/20 1040  BP: 120/73  Pulse: 80  Weight: 146 lb (66.2 kg)    Fetal Status: Fetal Heart Rate (bpm): 142 Fundal Height: 25 cm Movement: Present     General:  Alert, oriented and cooperative. Patient is in no acute distress.  Skin: Skin is warm and dry. No rash noted.   Cardiovascular: Normal heart rate noted  Respiratory: Normal respiratory effort, no problems with respiration noted  Abdomen: Soft, gravid, appropriate for gestational age. Pain/Pressure: Absent     Pelvic:  Cervical exam deferred        Extremities: Normal range of motion.  Edema: None  Mental Status: Normal mood and affect. Normal behavior. Normal judgment and thought content.   Urinalysis:      Assessment and Plan:  Pregnancy: G1P0000 at [redacted]w[redacted]d  1. Encounter for supervision of normal first pregnancy in first trimester Taking low dose aspirin Advised to sign up for classes Info on pediatricians given in AVS  Reviewed guidance for next visit - fasting for glucola, rhogam, and TDAP - wants TDAP Next [place] on 11-29-20  2. GAD (generalized anxiety disorder) Reviewed depression in pregnancy  Advised that issues may resurface in pregnancy and  postpartum Agreeable to talking to behavioral health  - Ambulatory referral to Integrated Behavioral Health  3. Panic disorder No problems at this time but would like more coping mechanisms  - Ambulatory referral to Integrated Behavioral Health   Preterm labor symptoms and general obstetric precautions including but not limited to vaginal bleeding, contractions, leaking of fluid and fetal movement were reviewed in detail with the patient. Please refer to After Visit Summary for other counseling recommendations.  Return in about 5 weeks (around 12/09/2020) for early AM for fasting glucola and ROB.  Nolene Bernheim, RN, MSN, NP-BC Nurse Practitioner, [facility] [employer], [facility] 11/04/2020 12:24 PM

## 2020-11-04 NOTE — Patient Instructions (Addendum)
[email or web address] for classes   AREA PEDIATRIC/FAMILY PRACTICE PHYSICIANS  Central/Southeast [place] ([id number]) . [facility] Melodie Bouillon, MD; Lum Babe, MD; Sheffield Slider, MD; Leveda Anna, MD; McDiarmid, MD; Jerene Bears, MD; Jennette Kettle, MD; Gwendolyn Grant, MD o [address] [place], [place] [address] o [phone] o Mon-Fri 8:30-12:30, 1:30-5:00 o Providers come to see babies at [facility] o Accepting Medicaid . [facility] [place] o Limited providers who accept newborns: Docia Chuck, MD; Kateri Plummer, MD; Paulino Rily, MD o [address], [place], [place] [address] o [phone] o Mon-Fri 8:00-5:30 o Babies seen by providers at [facility] o Does NOT accept Medicaid o Please call early in hospitalization for appointment (limited availability)  . [facility] Fatima Sanger, MD o [address] [place], [place] [address] o [phone] o Mon, Tue, Thur, Fri 8:30-5:00, Wed 10:00-7:00 (closed 1-2pm) o Babies seen by [facility] providers o Accepting Medicaid . Donnie Coffin - Pediatrician Fae Pippin, MD o [address], [place], [place] [address] o [phone] o Mon-Fri 8:30-5:00, Sat 8:30-12:00 o Provider comes to see babies at [facility] o Accepting Medicaid o Must have been referred from current patients or contacted office prior to delivery . [facility]) Leotis Pain, MD; Ave Filter, MD; Luna Fuse, MD; Kennedy Bucker, MD; Konrad Dolores, MD; Kathlene November, MD; Jenne Campus, MD; Lubertha South, MD; Wynetta Emery, MD; Duffy Rhody, MD; Gerre Couch, NP; Shirl Harris, NP o [address] [place], [place], [place] [address] o [phone] o Mon, Tue, Thur, Fri 8:30-5:30, Wed 9:30-5:30, Sat 8:30-12:30 o Babies seen by [facility] providers o Accepting Medicaid o Only accepting infants of first-time parents or siblings of current patients [facility] discharge coordinator will make follow-up  appointment . Cyril Mourning o [address] [place], [place]  [address] o [phone]   Fax - [phone] . [facility] o [address], [place], [place]  [address] o Phone - [phone]   Fax - [phone] . Lucio Edward o [address], [place], [place]  [address] o [phone]  East/[place]ortheast [place] [phone]) . [facility] Jorge Mandril, MD; Alita Chyle, MD; Princella Ion, MD; MD; Earlene Plater, MD; Jamesetta Orleans, MD; Alvera Novel, MD; Clarene Duke, MD; Rana Snare, MD; Carmon Ginsberg, MD; Alinda Money, MD; Hosie Poisson, MD; Mayford Knife, MD o [address] [place], [place] [address] o [phone] o Mon-Fri 8:30-5:00 (extended evenings Mon-Thur as needed), Sat-Sun 10:00-1:00 o Providers come to see babies at [facility] o Accepting Medicaid for families of first-time babies and families with all children in the household age 36 and under. Must register with office prior to making appointment (M-F only). [facility] Odella Aquas, NP; Lynelle Doctor, MD; Susann Givens, MD; [place], [place] o [address], [place], [place] [address] o [phone] o Mon-Fri 8:00-5:00 o Babies seen by providers at [facility] o Does NOT accept Medicaid/Commercial Insurance Only . [facility] [place] ([facility])  Suzette Battiest, MD; Zachery Dauer, MD; Stefan Church, MD; Sabino Dick, MD; Quitman Livings, MD; Farris Has, MD; Gaynell Face, MD; Betha Loa, MD; Colon Flattery, MD; Clifton James, MD o [address] [place], [place] [address] o [phone] o Mon-Fri 8:30-5:30, Sat (Oct.-Mar.) 9:00-1:00 o Babies seen by providers at [facility] o Accepting [facility] [phone]) . [facility] Dimitri, MD; Sheliah Hatch, MD o [address], [place], [place] [address] o [phone] o Mon-Fri 8:30-5:00, Sat 8:30-12:00 o Providers come to see babies at [facility] o Does NOT accept Medicaid .  [facility] Cindy Hazy, [place]; [place], MD; [place], [place]; Wynelle Link, MD;  Azucena Cecil, MD o [address] [place], [place] [address] o [phone] o Mon-Fri 8:00-5:00 o Babies seen by providers at [facility] o Does NOT accept Medicaid o Only accepting babies of parents who are patients o Please call early in hospitalization for appointment (limited availability) . [facility] Lamar Benes, MD; Abran Cantor, MD; Early Osmond, MD; Cherre Huger, NP; Hyacinth Meeker, MD; Dwan Bolt, MD; Jarold Motto, NP; Dario Guardian, MD; Talmage Nap, MD; Maisie Fus, MD; Pricilla Holm, MD; Tama High, MD o [address] [place], [place], [place] [address] o [phone] o Mon-Fri 8:00-5:00, Sat 9:00-12:00 o Providers come to see babies at [facility] o Does NOT accept [facility] [phone]) . [facility] at [facility] o Limited providers accepting new patients: Drema Pry, NP; [place], PA o [address], [place], [place] [address] o [phone] o Mon-Fri 8:00-5:00 o Babies seen by providers at [facility] o Does NOT accept Medicaid o Only accepting babies of parents who are patients o Please call early in hospitalization for appointment (limited availability) . [facility] Luan Pulling, MD; Nash Dimmer, MD o [address] [place] [place], [place] [address] o [phone] (press 1 to schedule appointment) o Mon-Fri 8:00-5:00 o Providers come to see babies at [facility] o Does NOT accept Medicaid . [facility] Cristino Martes, MD o [address], [place], [place] [address] o [phone] o Mon-Fri 8:30-5:00 (lunch 12:30-1:00), extended hours by appointment only Wed 5:00-6:30 o Babies seen by [facility] providers o Accepting Medicaid . [facility] Abbot, MD; [place], MD; Hassan Rowan, MD o [address] [place], [place], [place] [address] o [phone] o Mon-Fri 8:00-5:00 o Babies seen by [facility] providers o Does NOT accept Medicaid . Nature conservation officer at [place] [address] Elsworth Soho, MD; Durene Cal, MD; [place], DO o [address] [place], [place] [address] o [phone] o Mon-Fri 8:00-5:00 o Babies seen by [facility] providers o Does NOT accept Medicaid . [facility] o [place], [place]; [place], [place]; Rosita, NP; Avis Epley, MD; Vonna Kotyk, MD; Clance Boll, MD; Stevphen Rochester, NP; Arvilla Market, NP; Ann Maki, NP; Otis Dials, NP; Vaughan Basta, MD; [place], MD o [address] [place], [place] [address] o [phone] o Mon-Fri 8:30-5:00, Sat 10:00-1:00 o Providers come to see babies at [facility] o Does NOT accept Medicaid o Free prenatal information session Tuesdays at 4:45pm . [facility] ([facility]-Rh) Luna Kitchens, MD; [place], [place]; [place], [place]; [place], [place] o [address] [place] [address] o [phone] o Mon-Fri 7:30-5:30 o Babies seen by [facility] providers . [facility] o [address], [place], [place]  [address] o [phone]   Fax - [phone]  [place] [phone] & [phone]) . [facility] Alphonsa Overall, MD o [address] [place], [place] [address] o [phone] o Mon-Thur 8:00-6:00 o Providers come to see babies at [facility] o Accepting Medicaid . [facility] Zenon Mayo, NP; Cyndia Bent, MD; [place], [place]; [place], [place] o [address] [place], [place] [address] o [phone] o Mon-Thur 7:30-7:30, Fri 7:30-4:30 o Babies seen by [facility] providers o Accepting Medicaid . [facility] Cheryle Horsfall, MD; Janene Harvey, NP; Vonita Moss, MD o [address], [place], [place] [address] o [phone] o Mon-Fri 8:30-5:00, Sat 8:30-12:00 o Providers come to see babies at [facility] o Accepting Medicaid o Must have "Meet & Greet" appointment at office prior to delivery . [facility] - [facility] [place]) Llana Aliment, MD; Earlene Plater, MD; Lucretia Roers, MD o [address], [place], [place] [address] o [phone] o Mon-Wed 8:00-6:00, Thur-Fri 8:00-5:00, Sat  9:00-12:00 o Providers come to see babies at [facility] o Does NOT accept Medicaid o Only accepting siblings of current patients . [facility]  o [address], [place], [place]  [address] o [phone]   Fax - [phone] . [facility] o [phone] [address], [place], [place]  [address] o [phone]   Fax - [phone]  [place] [phone] & [phone]) . Nature conservation officer at [facility] o [place], DO; Sesser, DO o [address] [place], [place] [address] o [phone] o Mon-Fri 7:00-5:00 o Babies seen by [facility] providers o Does NOT accept Medicaid . [facility] Ellis Savage, MD; [place], [place]; [place], [place] o [address], [place], [place] [address] o [phone] o Mon-Fri 8:00-5:00 o Babies seen by [facility] providers o Accepting Medicaid . [facility] - [address] Franne Forts, MD; [place], [place]; [place], NP; [place], [place] o [address] [place], [place], [place] [address] o [phone] o Mon-Fri 8:00-5:00 o Babies seen by providers at [facility] o Accepting [facility] [place] [phone]) . [facility] Vail Radisson, [place] o [address] [place], [place] [address] o [phone] o Mon-Fri 8:00-5:00 o Babies seen by [facility] providers o Does NOT accept Medicaid o Limited availability, please call early in hospitalization to schedule follow-up . [facility] Jolee Ewing, PA; Eddie Candle, MD; Hood, MD; [place], [place]; Constance Goltz, MD; [place], [place] o [facility] [facility] [address], [place], [place] [address] o [phone] o Mon-Fri 8:30-5:00, Sat 9:00-12:00 o Babies seen by providers at [facility] o Accepting Medicaid o Please register online then schedule online or call office o [email or web address] . [facility] - [facility]  [facility]) Samuella Bruin, NP; Lucianne Muss, MD; Lanier Clam, PA o [address], [place], [place] [address] o [phone] o Mon-Fri 8:00-5:00 o Babies seen by providers at [facility] o Accepting Medicaid . [facility] ([facility] [facility]) Sharin Mons, MD; Reed Breech, NP; Shelva Majestic, MD o [address], [place], [place] [address] o [phone] o Mon-Fri 8:00-5:30, Sat&Sun by appointment (phones open at 8:30) o Babies seen by [facility] providers o Accepting Medicaid o Must be a first-time baby or sibling of current patient . [facility] [address], [place], [place]  [address] o [phone]   Fax - [phone]  [place] [phone] & [phone]) . [facility] o [place], [place]; [place], [place]; Dimple Casey, MD; [place], [place]; Carolyne Fiscal, MD o [address] [place], [place] [address] o [phone] o Mon-Thur 8:00-7:00, Fri 8:00-5:00, Sat 8:00-12:00, Sun 9:00-12:00 o Babies seen by [facility] providers o Accepting Medicaid . [facility] at [facility], MD; Gaynell Face, MD; [facility], MD o [address], [place], [place] [address] o [phone] o Mon-Thur 8:00-5:00 o Babies seen by providers at [facility] o Accepting Medicaid . [facility] [place] Gwenlyn Saran, MD; Coe-Goins, MD; Madilyn Fireman, MD; Melvyn Neth, MD; List, MD; Lazarus Salines, MD; Gaynell Face, MD; Berneda Rose, MD; Flora Lipps, MD; Beryl Meager, MD; Luther Redo, MD; Lavonia Drafts, MD; Kellie Simmering, MD o [address] [place], [place] [address] o [phone] o Mon-Fri 8:00-5:30, Sat (Oct.-Mar.) 9:00-1:00 o Babies seen by providers at [facility] o Accepting Medicaid o Must fill out new patient packet, available online at [email or web address] . [facility] [address] [facility] [address]) Simone Curiao Friddle, NP;  Tiburcio PeaHarris, NP; Tresa EndoKelly, NP; Whitney PostLogan, MD; [place], [place]; Hennie DuosPoth, MD; ModestoRamadoss, MD; Kavin LeechStanton, NP o [address], [place], [place] [address] o [phone] o Mon-Thur 8:00-5:30, Fri 8:00-5:00 o Babies seen by providers at [facility] o Accepting [facility]  [place] [phone]) . [facility] o [place], [place]; TiraDurham, MD; Tanya NonesPickard, MD; [place], [place] o [address], [place], [place] [address] o [phone] o Mon-Fri 8:00-5:00 o Babies seen by providers at [facility] o Accepting [facility]   [place] [phone]) . [facility] o AlgonaMasneri, DO; Lenise ArenaMeyers, MD; [place], [place] o [address], [place], [place] [address] o [phone] o Mon-Fri 8:00-5:00 o Babies seen by providers at [facility] o Does NOT accept Medicaid o Limited appointment availability, please call early in hospitalization  . Nature conservation officerLeBauer [facility]Care at [facility] o [place]-LenolaKunedd, DO; RittmanMcGowen, MD o [address] [place] [address], [place], [place] [address] o [phone] o Mon-Fri 8:00-5:00 o Babies seen by [facility] providers o Does NOT accept Medicaid . [facility] Lorrine Kino Cameron, MD; Ninetta LightsMacDonald, MD; [place], [place]; [place]villeNayak, MD o [address], [place], [place] [address] o [phone] o Mon-Fri 8:00-5:00 o After hours clinic [facility]([address] [place], [place] [address]) [phone] Mon-Fri 5:00-8:00, Sat 12:00-6:00, Sun 10:00-4:00 o Babies seen by [facility] providers o Accepting Medicaid . [facility] o [address], [place], [place]  [address] o [phone]   Fax - [phone]  [place] [phone]) . Nature conservation officerLeBauer HealthCare at [facility] o Andy, MD o [address] [place], [place], [place] [address] o [phone] o Mon-Fri 8:00-5:00 o Babies seen by [facility] providers o Does NOT accept Medicaid . [facility] [place] [facility] [place]) Tomi Likenso Eksir, MD o [address], [place], [place] [address] o [phone] o Mon-Thur 8:00-7:00, Fri 8:00-5:00, Sat 8:00-12:00 o Babies seen by providers at [facility] o Accepting Medicaid - but does not have vaccinations in office (must be received elsewhere) o Limited availability, please call early in hospitalization  [place] ([phone]) . [facility]  o Wyvonne Lenzharlene Flemming, MD o [address], [place] [address] o [phone]  Fax [phone]

## 2020-11-15 ENCOUNTER — Encounter: Payer: Medicaid Other | Admitting: Licensed Clinical Social Worker

## 2020-11-29 ENCOUNTER — Other Ambulatory Visit: Payer: Self-pay | Admitting: Maternal & Fetal Medicine

## 2020-11-29 ENCOUNTER — Telehealth: Payer: Self-pay | Admitting: Licensed Clinical Social Worker

## 2020-11-29 ENCOUNTER — Ambulatory Visit (INDEPENDENT_AMBULATORY_CARE_PROVIDER_SITE_OTHER): Payer: Medicaid Other | Admitting: Licensed Clinical Social Worker

## 2020-11-29 ENCOUNTER — Ambulatory Visit: Payer: Medicaid Other | Admitting: *Deleted

## 2020-11-29 ENCOUNTER — Other Ambulatory Visit: Payer: Self-pay | Admitting: *Deleted

## 2020-11-29 ENCOUNTER — Ambulatory Visit: Payer: Medicaid Other | Attending: Maternal & Fetal Medicine

## 2020-11-29 ENCOUNTER — Other Ambulatory Visit: Payer: Self-pay

## 2020-11-29 ENCOUNTER — Encounter: Payer: Self-pay | Admitting: *Deleted

## 2020-11-29 DIAGNOSIS — Z3A00 Weeks of gestation of pregnancy not specified: Secondary | ICD-10-CM | POA: Diagnosis not present

## 2020-11-29 DIAGNOSIS — O99340 Other mental disorders complicating pregnancy, unspecified trimester: Secondary | ICD-10-CM | POA: Diagnosis not present

## 2020-11-29 DIAGNOSIS — O10012 Pre-existing essential hypertension complicating pregnancy, second trimester: Secondary | ICD-10-CM | POA: Diagnosis not present

## 2020-11-29 DIAGNOSIS — O10919 Unspecified pre-existing hypertension complicating pregnancy, unspecified trimester: Secondary | ICD-10-CM

## 2020-11-29 DIAGNOSIS — F419 Anxiety disorder, unspecified: Secondary | ICD-10-CM | POA: Diagnosis not present

## 2020-11-29 DIAGNOSIS — Z362 Encounter for other antenatal screening follow-up: Secondary | ICD-10-CM

## 2020-11-29 DIAGNOSIS — O365990 Maternal care for other known or suspected poor fetal growth, unspecified trimester, not applicable or unspecified: Secondary | ICD-10-CM

## 2020-11-29 DIAGNOSIS — O321XX0 Maternal care for breech presentation, not applicable or unspecified: Secondary | ICD-10-CM

## 2020-11-29 DIAGNOSIS — Z3A26 26 weeks gestation of pregnancy: Secondary | ICD-10-CM

## 2020-11-29 DIAGNOSIS — O360120 Maternal care for anti-D [Rh] antibodies, second trimester, not applicable or unspecified: Secondary | ICD-10-CM

## 2020-11-29 DIAGNOSIS — O365920 Maternal care for other known or suspected poor fetal growth, second trimester, not applicable or unspecified: Secondary | ICD-10-CM | POA: Diagnosis not present

## 2020-11-29 DIAGNOSIS — Z3401 Encounter for supervision of normal first pregnancy, first trimester: Secondary | ICD-10-CM | POA: Diagnosis not present

## 2020-11-29 NOTE — Telephone Encounter (Signed)
Called pt regarding scheduled mychart visit. Left message for callback  

## 2020-11-29 NOTE — BH Specialist Note (Signed)
Integrated Behavioral Health via Telemedicine Visit  11/29/2020 Galileah Piggee [id number]  Number of Integrated Behavioral Health visits: 1/6 Session Start time: 1:00pm Session End time: 1:20pm Total time: 20 mins via phone due to mychart connection issue   Referring Provider: Lilyan Punt NP Patient/Family location: Home  [employer] Provider location: [facility]  All persons participating in visit: Pt D Bas and LCSWA A. Grier Czerwinski  Types of Service: Individual psychotherapy/phone visit  I connected with Audrie Gallus and/or Safiya Maddocks's n/a via  Telephone or Engineer, civil (consulting)  (Video is Caregility application) and verified that I am speaking with the correct person using two identifiers. Discussed confidentiality: no   I discussed the limitations of telemedicine and the availability of in person appointments.  Discussed there is a possibility of technology failure and discussed alternative modes of communication if that failure occurs.  I discussed that engaging in this telemedicine visit, they consent to the provision of behavioral healthcare and the services will be billed under their insurance.  Patient and/or legal guardian expressed understanding and consented to Telemedicine visit: yes   Presenting Concerns: Patient and/or family reports the following symptoms/concerns: anxious mood  Duration of problem: approx two months; Severity of problem: mild  Patient and/or Family's Strengths/Protective Factors: Ms. Heckert reports secured connections in place such as supportive family and secured housing   Goals Addressed: Patient will: 1.  Reduce symptoms of: history of anxiety   2.  Increase knowledge and/or ability of: diagnosis and apply coping skills to alleviate symptoms   3.  Demonstrate ability to: self manage symptoms   Progress towards Goals: Ongoing   Interventions: Interventions utilized:  Supportive counseling  Standardized Assessments  completed:n/a Assessment: Patient currently experiencing anxiety affecting pregnancy. Ms. Campton was tearful during appt. Ms. Wakeland reports issues with sleep, overthinking, easily overwhelmed and loss of interest. Ms. Otte reports anxiety diagnosis one year ago. Ms. Pryce reports prescribed anxiety medication ativan,and trazodone. Ms. Kluesner reports she stop taking these prescribed medication prior to pregnancy  Patient may benefit from integrated behavioral health   Plan: 1. Follow up with behavioral health clinician on : two weeks via mychart  2. Behavioral recommendations: Prioritize rest, engage in stress reducing and relaxation techniques to help with blood pressure concerns and boost mood. Collaborate with doctors regarding all medical concerns.  3. Referral(s): integrated behavioral health   I discussed the assessment and treatment plan with the patient and/or parent/guardian. They were provided an opportunity to ask questions and all were answered. They agreed with the plan and demonstrated an understanding of the instructions.   They were advised to call back or seek an in-person evaluation if the symptoms worsen or if the condition fails to improve as anticipated.  Gwyndolyn Saxon, LCSW

## 2020-12-05 ENCOUNTER — Telehealth: Payer: Self-pay

## 2020-12-05 NOTE — Telephone Encounter (Signed)
TC from baby scripts, patient blood pressure 142/76. Call patient to check on her. Phone just rang with no voice mail.

## 2020-12-09 ENCOUNTER — Ambulatory Visit (INDEPENDENT_AMBULATORY_CARE_PROVIDER_SITE_OTHER): Payer: Medicaid Other

## 2020-12-09 ENCOUNTER — Telehealth: Payer: Self-pay

## 2020-12-09 ENCOUNTER — Other Ambulatory Visit: Payer: Self-pay

## 2020-12-09 ENCOUNTER — Other Ambulatory Visit: Payer: Medicaid Other

## 2020-12-09 VITALS — BP 121/80 | HR 88 | Wt 154.0 lb

## 2020-12-09 DIAGNOSIS — Z3A27 27 weeks gestation of pregnancy: Secondary | ICD-10-CM | POA: Diagnosis not present

## 2020-12-09 DIAGNOSIS — O26899 Other specified pregnancy related conditions, unspecified trimester: Secondary | ICD-10-CM | POA: Insufficient documentation

## 2020-12-09 DIAGNOSIS — I10 Essential (primary) hypertension: Secondary | ICD-10-CM

## 2020-12-09 DIAGNOSIS — Z6791 Unspecified blood type, Rh negative: Secondary | ICD-10-CM

## 2020-12-09 DIAGNOSIS — Z3400 Encounter for supervision of normal first pregnancy, unspecified trimester: Secondary | ICD-10-CM

## 2020-12-09 HISTORY — DX: Other specified pregnancy related conditions, unspecified trimester: O26.899

## 2020-12-09 NOTE — Progress Notes (Signed)
+   Fetal movement. No complaints.  

## 2020-12-09 NOTE — Progress Notes (Signed)
LOW-RISK PREGNANCY OFFICE VISIT  Patient name: Anita Hoover MRN [id number]  Date of birth: 11-Jun-1996 Chief Complaint:   Routine Prenatal Visit  Subjective:   Anita Hoover is a 25 y.o. G17P0000 female at [redacted]w[redacted]d with an Estimated Date of Delivery: 03/07/21 being seen today for ongoing management of a low-risk pregnancy aeb has HTN (hypertension); Acne; GAD (generalized anxiety disorder); Panic disorder; Encounter for supervision of normal first pregnancy in first trimester; and Rh negative, antepartum on their problem list.  Patient presents today with no complaints.  Patient endorses fetal movement. Patient denies abdominal cramping or contractions.  Patient denies vaginal concerns including abnormal discharge, leaking of fluid, and bleeding.  Contractions: Not present. Vag. Bleeding: None.  Movement: Present.  Patient states that she plans to have her mother be her doula.  She reports that her mother currently resides out of state and she would like for her to skype in for some appts.   Reviewed past medical,surgical, social, obstetrical and family history as well as problem list, medications and allergies.  Objective   Vitals:   12/09/20 0931  BP: 121/80  Pulse: 88  Weight: 154 lb (69.9 kg)  Body mass index is 25.63 kg/m.  Total Weight Gain:9 lb (4.082 kg)         Physical Examination:   General appearance: Well appearing, and in no distress  Mental status: Alert, oriented to person, place, and time  Skin: Warm & dry  Cardiovascular: Normal heart rate noted  Respiratory: Normal respiratory effort, no distress  Abdomen: Soft, gravid, nontender, AGA with Fundal Height: 26 cm  Pelvic: Cervical exam deferred           Extremities: Edema: None  Fetal Status: Fetal Heart Rate (bpm): 135  Movement: Present   No results found for this or any previous visit (from the past 24 hour(s)).  Assessment & Plan:  Low-risk pregnancy of a 25 y.o., G1P0000 at [redacted]w[redacted]d with an Estimated Date of  Delivery: 03/07/21   1. Supervision of normal first pregnancy, antepartum -Anticipatory guidance for upcoming appts. -Patient to next appt in 3 weeks for an in-person visit. -Informed that it is okay to have her mother skype into some appts if she desires. -Instructed to inform provider/MD that she has someone on the phone/video.   2. [redacted] weeks gestation of pregnancy -Doing well. -Reviewed blood draw procedures and labs which also include check of iron level, HIV, and RPR status.  -Discussed how results of GTT are handled including diabetic education and BS testing for abnormal results and routine care for normal results.   3. Hypertension, unspecified type -Taking bASA. -BP normotensive today  4. Rh negative, antepartum -Will plan to give Rhogam at next appt.      Meds: No orders of the defined types were placed in this encounter.  Labs/procedures today:   Lab Orders     Glucose Tolerance, 2 Hours w/1 Hour     CBC     RPR     HIV Antibody (routine testing w rflx)   Reviewed: Preterm labor symptoms and general obstetric precautions including but not limited to vaginal bleeding, contractions, leaking of fluid and fetal movement were reviewed in detail with the patient.  All questions were answered.  Follow-up: Return in about 3 weeks (around 12/30/2020) for LROB with Rhogam .  Orders Placed This Encounter  Procedures  . Glucose Tolerance, 2 Hours w/1 Hour  . CBC  . RPR  . HIV Antibody (routine testing w rflx)  Cherre Robins MSN, CNM 12/09/2020

## 2020-12-09 NOTE — Telephone Encounter (Signed)
Received call from [facility] reporting an elevated reading of 141/81. Patient is currently in office for her prenatal visit. BP was normal for visit. Advised patient to continue monitoring BP at home.

## 2020-12-09 NOTE — Patient Instructions (Signed)
Rh0 [D] Immune Globulin injection What is this medicine? RhO [D] IMMUNE GLOBULIN (i MYOON GLOB yoo lin) is used to treat idiopathic thrombocytopenic purpura (ITP). This medicine is used in RhO negative mothers who are pregnant with a RhO positive child. It is also used after a transfusion of RhO positive blood into a RhO negative person. This medicine may be used for other purposes; ask your health care provider or pharmacist if you have questions. COMMON BRAND NAME(S): BayRho-D, HyperRHO S/D, MICRhoGAM, RhoGAM, Rhophylac, WinRho SDF What should I tell my health care provider before I take this medicine? They need to know if you have any of these conditions:  bleeding disorders  low levels of immunoglobulin A in the body  no spleen  an unusual or allergic reaction to human immune globulin, other medicines, foods, dyes, or preservatives  pregnant or trying to get pregnant  breast-feeding How should I use this medicine? This medicine is for injection into a muscle or into a vein. It is given by a health care professional in a hospital or clinic setting. Talk to your pediatrician regarding the use of this medicine in children. This medicine is not approved for use in children. Overdosage: If you think you have taken too much of this medicine contact a poison control center or emergency room at once. NOTE: This medicine is only for you. Do not share this medicine with others. What if I miss a dose? It is important not to miss your dose. Call your doctor or health care professional if you are unable to keep an appointment. What may interact with this medicine?  live virus vaccines, like measles, mumps, or rubella This list may not describe all possible interactions. Give your health care provider a list of all the medicines, herbs, non-prescription drugs, or dietary supplements you use. Also tell them if you smoke, drink alcohol, or use illegal drugs. Some items may interact with your  medicine. What should I watch for while using this medicine? This medicine is made from human blood. It may be possible to pass an infection in this medicine. Talk to your doctor about the risks and benefits of this medicine. This medicine may interfere with live virus vaccines. Before you get live virus vaccines tell your health care professional if you have received this medicine within the past 3 months. What side effects may I notice from receiving this medicine? Side effects that you should report to your doctor or health care professional as soon as possible:  allergic reactions like skin rash, itching or hives, swelling of the face, lips, or tongue  breathing problems  chest pain or tightness  yellowing of the eyes or skin Side effects that usually do not require medical attention (report to your doctor or health care professional if they continue or are bothersome):  fever  pain and tenderness at site where injected This list may not describe all possible side effects. Call your doctor for medical advice about side effects. You may report side effects to FDA at 1-800-FDA-1088. Where should I keep my medicine? This drug is given in a hospital or clinic and will not be stored at home. NOTE: This sheet is a summary. It may not cover all possible information. If you have questions about this medicine, talk to your doctor, pharmacist, or health care provider.  2021 Elsevier/Gold Standard (2008-03-15 14:06:10)  

## 2020-12-13 ENCOUNTER — Encounter: Payer: Medicaid Other | Admitting: Licensed Clinical Social Worker

## 2020-12-13 LAB — CBC
Hematocrit: 33 % — ABNORMAL LOW (ref 34.0–46.6)
Hemoglobin: 11.1 g/dL (ref 11.1–15.9)
MCH: 30.9 pg (ref 26.6–33.0)
MCHC: 33.6 g/dL (ref 31.5–35.7)
MCV: 92 fL (ref 79–97)
Platelets: 216 10*3/uL (ref 150–450)
RBC: 3.59 x10E6/uL — ABNORMAL LOW (ref 3.77–5.28)
RDW: 12.3 % (ref 11.7–15.4)
WBC: 12.7 10*3/uL — ABNORMAL HIGH (ref 3.4–10.8)

## 2020-12-13 LAB — GLUCOSE TOLERANCE, 2 HOURS W/ 1HR
Glucose, 1 hour: 125 mg/dL (ref 65–179)
Glucose, 2 hour: 118 mg/dL (ref 65–152)
Glucose, Fasting: 81 mg/dL (ref 65–91)

## 2020-12-13 LAB — RPR: RPR Ser Ql: NONREACTIVE

## 2020-12-13 LAB — HIV ANTIBODY (ROUTINE TESTING W REFLEX): HIV Screen 4th Generation wRfx: NONREACTIVE

## 2020-12-16 ENCOUNTER — Other Ambulatory Visit: Payer: Self-pay | Admitting: Maternal & Fetal Medicine

## 2020-12-16 ENCOUNTER — Ambulatory Visit (HOSPITAL_BASED_OUTPATIENT_CLINIC_OR_DEPARTMENT_OTHER): Payer: Medicaid Other | Admitting: *Deleted

## 2020-12-16 ENCOUNTER — Other Ambulatory Visit: Payer: Self-pay

## 2020-12-16 ENCOUNTER — Encounter: Payer: Self-pay | Admitting: *Deleted

## 2020-12-16 ENCOUNTER — Ambulatory Visit: Payer: Medicaid Other | Admitting: *Deleted

## 2020-12-16 ENCOUNTER — Ambulatory Visit: Payer: Medicaid Other | Attending: Obstetrics

## 2020-12-16 DIAGNOSIS — Z3A28 28 weeks gestation of pregnancy: Secondary | ICD-10-CM

## 2020-12-16 DIAGNOSIS — O10013 Pre-existing essential hypertension complicating pregnancy, third trimester: Secondary | ICD-10-CM | POA: Diagnosis not present

## 2020-12-16 DIAGNOSIS — Z364 Encounter for antenatal screening for fetal growth retardation: Secondary | ICD-10-CM

## 2020-12-16 DIAGNOSIS — O365990 Maternal care for other known or suspected poor fetal growth, unspecified trimester, not applicable or unspecified: Secondary | ICD-10-CM | POA: Insufficient documentation

## 2020-12-16 DIAGNOSIS — O10919 Unspecified pre-existing hypertension complicating pregnancy, unspecified trimester: Secondary | ICD-10-CM | POA: Insufficient documentation

## 2020-12-16 DIAGNOSIS — Z3401 Encounter for supervision of normal first pregnancy, first trimester: Secondary | ICD-10-CM

## 2020-12-16 DIAGNOSIS — O365930 Maternal care for other known or suspected poor fetal growth, third trimester, not applicable or unspecified: Secondary | ICD-10-CM

## 2020-12-16 DIAGNOSIS — O10913 Unspecified pre-existing hypertension complicating pregnancy, third trimester: Secondary | ICD-10-CM

## 2020-12-16 NOTE — Procedures (Signed)
Anita Hoover 07-Oct-1995 [redacted]w[redacted]d  Fetus A Non-Stress Test Interpretation for 12/16/20  Indication: Chronic Hypertenstion  Fetal Heart Rate A Mode: External Baseline Rate (A): 130 bpm Variability: Moderate Accelerations: 10 x 10 Decelerations: None Multiple birth?: No  Uterine Activity Mode: Palpation,Toco Contraction Frequency (min): Occas UI Contraction Quality: Mild Resting Tone Palpated: Relaxed Resting Time: Adequate  Interpretation (Fetal Testing) Nonstress Test Interpretation: Reactive Overall Impression: Reassuring for gestational age Comments: Dr. Grace Bushy reviewed tracing

## 2020-12-21 ENCOUNTER — Other Ambulatory Visit: Payer: Self-pay

## 2020-12-21 MED ORDER — TERCONAZOLE 0.4 % VA CREA: 1.0000 | TOPICAL_CREAM | Freq: Every day | VAGINAL | 0 refills | Status: DC

## 2020-12-21 NOTE — Telephone Encounter (Signed)
Patient thinks she has a yeast infection. She would like to try the cream to help with her symptoms.

## 2020-12-23 ENCOUNTER — Encounter: Payer: Self-pay | Admitting: *Deleted

## 2020-12-23 ENCOUNTER — Ambulatory Visit (HOSPITAL_BASED_OUTPATIENT_CLINIC_OR_DEPARTMENT_OTHER): Payer: Medicaid Other | Admitting: *Deleted

## 2020-12-23 ENCOUNTER — Ambulatory Visit: Payer: Medicaid Other | Attending: Obstetrics

## 2020-12-23 ENCOUNTER — Other Ambulatory Visit: Payer: Self-pay

## 2020-12-23 ENCOUNTER — Ambulatory Visit: Payer: Medicaid Other | Admitting: *Deleted

## 2020-12-23 DIAGNOSIS — O360130 Maternal care for anti-D [Rh] antibodies, third trimester, not applicable or unspecified: Secondary | ICD-10-CM | POA: Diagnosis not present

## 2020-12-23 DIAGNOSIS — O365931 Maternal care for other known or suspected poor fetal growth, third trimester, fetus 1: Secondary | ICD-10-CM | POA: Diagnosis not present

## 2020-12-23 DIAGNOSIS — O10919 Unspecified pre-existing hypertension complicating pregnancy, unspecified trimester: Secondary | ICD-10-CM | POA: Diagnosis not present

## 2020-12-23 DIAGNOSIS — O365990 Maternal care for other known or suspected poor fetal growth, unspecified trimester, not applicable or unspecified: Secondary | ICD-10-CM | POA: Insufficient documentation

## 2020-12-23 DIAGNOSIS — Z3401 Encounter for supervision of normal first pregnancy, first trimester: Secondary | ICD-10-CM | POA: Insufficient documentation

## 2020-12-23 DIAGNOSIS — O10013 Pre-existing essential hypertension complicating pregnancy, third trimester: Secondary | ICD-10-CM

## 2020-12-23 DIAGNOSIS — O365930 Maternal care for other known or suspected poor fetal growth, third trimester, not applicable or unspecified: Secondary | ICD-10-CM | POA: Diagnosis not present

## 2020-12-23 DIAGNOSIS — Z3A29 29 weeks gestation of pregnancy: Secondary | ICD-10-CM

## 2020-12-23 NOTE — Procedures (Signed)
Anita Hoover 1995-10-05 [redacted]w[redacted]d  Fetus A Non-Stress Test Interpretation for 12/23/20  Indication: Chronic Hypertenstion  Fetal Heart Rate A Mode: External Baseline Rate (A): 135 bpm Variability: Moderate Accelerations: 10 x 10 Multiple birth?: No  Uterine Activity Mode: Palpation,Toco Contraction Frequency (min): UI Contraction Quality: Mild Resting Tone Palpated: Relaxed Resting Time: Adequate  Interpretation (Fetal Testing) Nonstress Test Interpretation: Reactive Overall Impression: Reassuring for gestational age Comments: Dr. Parke Poisson reviewed tracing

## 2020-12-30 ENCOUNTER — Ambulatory Visit: Payer: Medicaid Other

## 2020-12-30 ENCOUNTER — Ambulatory Visit (INDEPENDENT_AMBULATORY_CARE_PROVIDER_SITE_OTHER): Payer: Medicaid Other | Admitting: Obstetrics and Gynecology

## 2020-12-30 ENCOUNTER — Other Ambulatory Visit: Payer: Self-pay

## 2020-12-30 DIAGNOSIS — Z3401 Encounter for supervision of normal first pregnancy, first trimester: Secondary | ICD-10-CM

## 2020-12-30 DIAGNOSIS — Z2913 Encounter for prophylactic Rho(D) immune globulin: Secondary | ICD-10-CM | POA: Diagnosis not present

## 2020-12-30 DIAGNOSIS — Z23 Encounter for immunization: Secondary | ICD-10-CM

## 2020-12-30 MED ORDER — RHO D IMMUNE GLOBULIN 1500 UNIT/2ML IJ SOSY
300.0000 ug | PREFILLED_SYRINGE | Freq: Once | INTRAMUSCULAR | Status: AC
  Administered 2020-12-30: 300 ug via INTRAMUSCULAR

## 2020-12-30 NOTE — Progress Notes (Signed)
   PRENATAL VISIT NOTE  Subjective:  Anita Hoover is a 25 y.o. G1P0000 at [redacted]w[redacted]d being seen today for ongoing prenatal care.  She is currently monitored for the following issues for this low-risk pregnancy and has HTN (hypertension); Acne; GAD (generalized anxiety disorder); Panic disorder; Encounter for supervision of normal first pregnancy in first trimester; Rh negative, antepartum; and Small for gestational age (SGA) on their problem list.  Patient reports no complaints.  Contractions: Not present. Vag. Bleeding: None.  Movement: Present. Denies leaking of fluid.   The following portions of the patient's history were reviewed and updated as appropriate: allergies, current medications, past family history, past medical history, past social history, past surgical history and problem list.   Objective:   Vitals:   12/30/20 0814  BP: 125/85  Pulse: 100  Weight: 158 lb (71.7 kg)    Fetal Status: Fetal Heart Rate (bpm): 130   Movement: Present     General:  Alert, oriented and cooperative. Patient is in no acute distress.  Skin: Skin is warm and dry. No rash noted.   Cardiovascular: Normal heart rate noted  Respiratory: Normal respiratory effort, no problems with respiration noted  Abdomen: Soft, gravid, appropriate for gestational age.  Pain/Pressure: Absent     Pelvic: Cervical exam deferred        Extremities: Normal range of motion.  Edema: None  Mental Status: Normal mood and affect. Normal behavior. Normal judgment and thought content.   Assessment and Plan:  Pregnancy: G1P0000 at [redacted]w[redacted]d  1. Encounter for supervision of normal first pregnancy in first trimester  rhogram and TDAP given today. Reviewed [facility] healthy baby website.   2. Small for gestational age (SGA)  Resolved, last MFM US showed normal growth.   Preterm labor symptoms and general obstetric precautions including but not limited to vaginal bleeding, contractions, leaking of fluid and fetal movement were  reviewed in detail with the patient. Please refer to After Visit Summary for other counseling recommendations.   No follow-ups on file.  Future Appointments  Date Time Provider Department Center  01/13/2021  1:30 PM [facility] NURSE [facility]  01/13/2021  1:45 PM [facility]  01/20/2021  1:30 PM [facility] NURSE [facility]  01/20/2021  1:45 PM [facility] US4 [facility]    Venia Carbon, NP

## 2021-01-06 ENCOUNTER — Ambulatory Visit: Payer: Medicaid Other

## 2021-01-06 ENCOUNTER — Ambulatory Visit: Payer: Self-pay

## 2021-01-09 ENCOUNTER — Telehealth: Payer: Self-pay

## 2021-01-09 ENCOUNTER — Encounter (HOSPITAL_COMMUNITY): Payer: Self-pay | Admitting: Obstetrics & Gynecology

## 2021-01-09 ENCOUNTER — Other Ambulatory Visit: Payer: Self-pay

## 2021-01-09 ENCOUNTER — Inpatient Hospital Stay (HOSPITAL_COMMUNITY)
Admission: AD | Admit: 2021-01-09 | Discharge: 2021-01-10 | Disposition: A | Discharge status: Home / Self Care | Payer: Medicaid Other | Attending: Obstetrics & Gynecology | Admitting: Obstetrics & Gynecology

## 2021-01-09 DIAGNOSIS — O23593 Infection of other part of genital tract in pregnancy, third trimester: Secondary | ICD-10-CM | POA: Diagnosis not present

## 2021-01-09 DIAGNOSIS — O98813 Other maternal infectious and parasitic diseases complicating pregnancy, third trimester: Secondary | ICD-10-CM | POA: Diagnosis not present

## 2021-01-09 DIAGNOSIS — O10013 Pre-existing essential hypertension complicating pregnancy, third trimester: Secondary | ICD-10-CM | POA: Diagnosis not present

## 2021-01-09 DIAGNOSIS — R109 Unspecified abdominal pain: Secondary | ICD-10-CM | POA: Diagnosis present

## 2021-01-09 DIAGNOSIS — B373 Candidiasis of vulva and vagina: Secondary | ICD-10-CM | POA: Insufficient documentation

## 2021-01-09 DIAGNOSIS — Z3A31 31 weeks gestation of pregnancy: Secondary | ICD-10-CM | POA: Diagnosis not present

## 2021-01-09 DIAGNOSIS — B3731 Acute candidiasis of vulva and vagina: Secondary | ICD-10-CM

## 2021-01-09 DIAGNOSIS — Z3A32 32 weeks gestation of pregnancy: Secondary | ICD-10-CM

## 2021-01-09 DIAGNOSIS — G44209 Tension-type headache, unspecified, not intractable: Secondary | ICD-10-CM | POA: Insufficient documentation

## 2021-01-09 DIAGNOSIS — Z3689 Encounter for other specified antenatal screening: Secondary | ICD-10-CM

## 2021-01-09 HISTORY — DX: Headache, unspecified: R51.9

## 2021-01-09 HISTORY — DX: Anxiety disorder, unspecified: F41.9

## 2021-01-09 HISTORY — DX: Depression, unspecified: F32.A

## 2021-01-09 LAB — CBC WITH DIFFERENTIAL/PLATELET
Abs Immature Granulocytes: 0.09 10*3/uL — ABNORMAL HIGH (ref 0.00–0.07)
Basophils Absolute: 0 10*3/uL (ref 0.0–0.1)
Basophils Relative: 0 %
Eosinophils Absolute: 0.3 10*3/uL (ref 0.0–0.5)
Eosinophils Relative: 3 %
HCT: 32.1 % — ABNORMAL LOW (ref 36.0–46.0)
Hemoglobin: 10.7 g/dL — ABNORMAL LOW (ref 12.0–15.0)
Immature Granulocytes: 1 %
Lymphocytes Relative: 19 %
Lymphs Abs: 2 10*3/uL (ref 0.7–4.0)
MCH: 30.4 pg (ref 26.0–34.0)
MCHC: 33.3 g/dL (ref 30.0–36.0)
MCV: 91.2 fL (ref 80.0–100.0)
Monocytes Absolute: 0.8 10*3/uL (ref 0.1–1.0)
Monocytes Relative: 8 %
Neutro Abs: 7.2 10*3/uL (ref 1.7–7.7)
Neutrophils Relative %: 69 %
Platelets: 215 10*3/uL (ref 150–400)
RBC: 3.52 MIL/uL — ABNORMAL LOW (ref 3.87–5.11)
RDW: 13.3 % (ref 11.5–15.5)
WBC: 10.5 10*3/uL (ref 4.0–10.5)
nRBC: 0 % (ref 0.0–0.2)

## 2021-01-09 LAB — COMPREHENSIVE METABOLIC PANEL
ALT: 17 U/L (ref 0–44)
AST: 19 U/L (ref 15–41)
Albumin: 2.9 g/dL — ABNORMAL LOW (ref 3.5–5.0)
Alkaline Phosphatase: 62 U/L (ref 38–126)
Anion gap: 7 (ref 5–15)
BUN: 7 mg/dL (ref 6–20)
CO2: 21 mmol/L — ABNORMAL LOW (ref 22–32)
Calcium: 8.8 mg/dL — ABNORMAL LOW (ref 8.9–10.3)
Chloride: 105 mmol/L (ref 98–111)
Creatinine, Ser: 0.64 mg/dL (ref 0.44–1.00)
GFR, Estimated: >60 mL/min (ref >60)
Glucose, Bld: 79 mg/dL (ref 70–99)
Potassium: 4 mmol/L (ref 3.5–5.1)
Sodium: 133 mmol/L — ABNORMAL LOW (ref 135–145)
Total Bilirubin: 0.4 mg/dL (ref 0.3–1.2)
Total Protein: 6.1 g/dL — ABNORMAL LOW (ref 6.5–8.1)

## 2021-01-09 LAB — URINALYSIS, ROUTINE W REFLEX MICROSCOPIC
Bilirubin Urine: NEGATIVE
Glucose, UA: NEGATIVE mg/dL
Hgb urine dipstick: NEGATIVE
Ketones, ur: NEGATIVE mg/dL
Nitrite: NEGATIVE
Protein, ur: NEGATIVE mg/dL
Specific Gravity, Urine: 1.012 (ref 1.005–1.030)
pH: 7 (ref 5.0–8.0)

## 2021-01-09 LAB — WET PREP, GENITAL
Clue Cells Wet Prep HPF POC: NONE SEEN
Sperm: NONE SEEN
Trich, Wet Prep: NONE SEEN

## 2021-01-09 LAB — PROTEIN / CREATININE RATIO, URINE
Creatinine, Urine: 98.26 mg/dL
Protein Creatinine Ratio: 0.08 mg/mg{Cre} (ref 0.00–0.15)
Total Protein, Urine: 8 mg/dL

## 2021-01-09 MED ORDER — ACETAMINOPHEN 500 MG PO TABS
1000.0000 mg | ORAL_TABLET | Freq: Once | ORAL | Status: AC
  Administered 2021-01-09: 1000 mg via ORAL
  Filled 2021-01-09: qty 2

## 2021-01-09 NOTE — Telephone Encounter (Signed)
TC from patient she reported having severe pain over the last several days. She has taken Tylenol with no relief.Patient does report pos. Fetal movement, however she reports increase stress over the past couple of days. I have advised patient to increase her water intake, take her blood pressure and go to Women and children unit tonight if the pain gets any worse.Patient voice under standing at this time.
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
## 2021-01-09 NOTE — MAU Note (Signed)
Pt reports feeling light headed and dizzy with ambulation. Reports intermittent uterine cramping and lower back pain. Denies SROM, vaginal bleeding or bloody show. Endorses + fetal movement.

## 2021-01-09 NOTE — MAU Note (Signed)
Pt reports over the last week she is experiencing increased stress and has noticed increased episodes of feeling light headed and dizziness. Vision tunnels. States she has not had loss of consciousness.

## 2021-01-10 MED ORDER — HYDROXYZINE PAMOATE 25 MG PO CAPS: 25.0000 mg | ORAL_CAPSULE | Freq: Three times a day (TID) | ORAL | 0 refills | Status: DC | PRN

## 2021-01-10 MED ORDER — MAG-OXIDE 200 MG PO TABS: 400.0000 mg | ORAL_TABLET | Freq: Every day | ORAL | 3 refills | Status: DC

## 2021-01-10 NOTE — Discharge Instructions (Signed)
For perinatal anxiety: Can take vistaril as prescribed for anxiety. Take 200mg  magnesium (up to 400mg ) at bedtime + 3mg  melatonin for better sleep. Can take 750mg  GABA daily for anxiety (over-the-counter)

## 2021-01-11 LAB — GC/CHLAMYDIA PROBE AMP (~~LOC~~) NOT AT ARMC
Chlamydia: NEGATIVE
Comment: NEGATIVE
Comment: NORMAL
Neisseria Gonorrhea: NEGATIVE

## 2021-01-13 ENCOUNTER — Ambulatory Visit: Payer: Medicaid Other | Attending: Maternal & Fetal Medicine

## 2021-01-13 ENCOUNTER — Encounter: Payer: Self-pay | Admitting: *Deleted

## 2021-01-13 ENCOUNTER — Other Ambulatory Visit: Payer: Self-pay

## 2021-01-13 ENCOUNTER — Ambulatory Visit (INDEPENDENT_AMBULATORY_CARE_PROVIDER_SITE_OTHER): Payer: Medicaid Other | Admitting: Nurse Practitioner

## 2021-01-13 ENCOUNTER — Ambulatory Visit: Payer: Medicaid Other | Admitting: *Deleted

## 2021-01-13 VITALS — BP 116/78 | HR 86 | Wt 157.8 lb

## 2021-01-13 VITALS — BP 122/81 | HR 77

## 2021-01-13 DIAGNOSIS — O10013 Pre-existing essential hypertension complicating pregnancy, third trimester: Secondary | ICD-10-CM

## 2021-01-13 DIAGNOSIS — Z362 Encounter for other antenatal screening follow-up: Secondary | ICD-10-CM

## 2021-01-13 DIAGNOSIS — O360130 Maternal care for anti-D [Rh] antibodies, third trimester, not applicable or unspecified: Secondary | ICD-10-CM | POA: Diagnosis not present

## 2021-01-13 DIAGNOSIS — F411 Generalized anxiety disorder: Secondary | ICD-10-CM

## 2021-01-13 DIAGNOSIS — Z3401 Encounter for supervision of normal first pregnancy, first trimester: Secondary | ICD-10-CM | POA: Insufficient documentation

## 2021-01-13 DIAGNOSIS — Z3A32 32 weeks gestation of pregnancy: Secondary | ICD-10-CM | POA: Diagnosis not present

## 2021-01-13 DIAGNOSIS — O365930 Maternal care for other known or suspected poor fetal growth, third trimester, not applicable or unspecified: Secondary | ICD-10-CM | POA: Insufficient documentation

## 2021-01-13 NOTE — Progress Notes (Signed)
    Subjective:  Anita Hoover is a 25 y.o. G1P0000 at [redacted]w[redacted]d being seen today for ongoing prenatal care.  She is currently monitored for the following issues for this low-risk pregnancy and has HTN (hypertension); Acne; GAD (generalized anxiety disorder); Panic disorder; Encounter for supervision of normal first pregnancy in first trimester; Rh negative, antepartum; and Small for gestational age (SGA) on their problem list.  Patient reports no complaints.  Contractions: Not present. Vag. Bleeding: None.  Movement: Present. Denies leaking of fluid.   The following portions of the patient's history were reviewed and updated as appropriate: allergies, current medications, past family history, past medical history, past social history, past surgical history and problem list. Problem list updated.  Objective:   Vitals:   01/13/21 1055  BP: 116/78  Pulse: 86  Weight: 157 lb 12.8 oz (71.6 kg)    Fetal Status: Fetal Heart Rate (bpm): 131 Fundal Height: 31 cm Movement: Present     General:  Alert, oriented and cooperative. Patient is in no acute distress.  Skin: Skin is warm and dry. No rash noted.   Cardiovascular: Normal heart rate noted  Respiratory: Normal respiratory effort, no problems with respiration noted  Abdomen: Soft, gravid, appropriate for gestational age. Pain/Pressure: Absent     Pelvic:  Cervical exam deferred        Extremities: Normal range of motion.  Edema: None  Mental Status: Normal mood and affect. Normal behavior. Normal judgment and thought content.   Urinalysis:      Assessment and Plan:  Pregnancy: G1P0000 at [redacted]w[redacted]d  1. Encounter for supervision of normal first pregnancy in first trimester Given list of pediatric providers and will call to confirm a pediatrician.  2. GAD (generalized anxiety disorder) Went to MAU this week and advised to take melatonin and GABA for anxiety and rest.  Client has gotten them and she has had positive results but is still  stressed. Her therapist has retired and she wants to look for a new provider. Has spoken with Sue Lush in the office previously.  Advised that Sue Lush is available and can assist her with resources and finding a therapist also. Stressed importance of follow up now as pregnancy can be a stressful time and there is also a risk of postpartum depression.  Advised to find support now during the pregnancy rather than waiting until her symptoms are overwhelming. Has been skipping some meals and was unaware that missing meals could contribute to labile emotional states. Drink at least 8 8-oz glasses of water every day.   Preterm labor symptoms and general obstetric precautions including but not limited to vaginal bleeding, contractions, leaking of fluid and fetal movement were reviewed in detail with the patient. Please refer to After Visit Summary for other counseling recommendations.  Return in about 2 weeks (around 01/27/2021) for in person ROB.  Nolene Bernheim, RN, MSN, NP-BC Nurse Practitioner, [facility] [employer], [facility] 01/13/2021 12:22 PM

## 2021-01-13 NOTE — Progress Notes (Signed)
Pt reports fetal movement, denies pain.  

## 2021-01-13 NOTE — Patient Instructions (Signed)
AREA PEDIATRIC/FAMILY PRACTICE PHYSICIANS  [place] ([address]) [facility] Chambliss, MD; Eniola, MD; Hale, MD; Hensel, MD; McDiarmid, MD; McIntyer, MD; Neal, MD; Walden, MD [address] [place], [place] [address] [phone] Mon-Fri 8:30-12:30, 1:30-5:00 Providers come to see babies at [facility] Accepting Medicaid [facility] providers who accept newborns: Koirala, MD; Morrow, MD; Wolters, MD [address], [place], [place] [address] [phone] Mon-Fri 8:00-5:30 Babies seen by providers at [facility] Does NOT accept Medicaid Please call early in hospitalization for appointment (limited availability)  [facility] Mulberry, MD [address] [place], [place] [address] [phone] Mon, Tue, Thur, Fri 8:30-5:00, Wed 10:00-7:00 (closed 1-2pm) Babies seen by [facility] providers Accepting Medicaid Rub[facility] - Pediatrician Rubin, MD [address], [place], [place] [address] [phone] Mon-Fri 8:30-5:00, Sat 8:30-12:00 Provider comes to see babies at [facility] Accepting Medicaid Must have been referred from current patients or contacted office prior to delivery [facility] ([facility]) Brown, MD; Chandler, MD; Ettefagh, MD; Grant, MD; Lester, MD; McCormick, MD; McQueen, MD; Prose, MD; Simha, MD; Stanley, MD; Stryffeler, NP; Tebben, NP [address], [place], [place] [address] [phone] Mon, Tue, Thur, Fri 8:30-5:30, Wed 9:30-5:30, Sat 8:30-12:30 Babies seen by [facility] providers Accepting Medicaid Only accepting infants of first-time parents or siblings of current patients Hospital discharge coordinator will make follow-up appointment Jack Amos [address], [place], [place]  [address] [phone]   Fax - [phone] [facility] [address], [place], [place]  [address] Phone - [phone]   Fax - [phone] Shilpa Gosrani [address], [place], [place]  [address] [phone]  [place] ([address]) [facility] Bates, MD; Brassfield, MD; Cooper, Cox, MD; MD; Davis, MD; Dovico, MD; Ettefaugh, MD; Little, MD; Lowe, MD; Keiffer, MD; Melvin, MD; Sumner, MD; Williams, MD [address], [place], [place] [address] [phone] Mon-Fri 8:30-5:00 (extended evenings Mon-Thur as needed), Sat-Sun 10:00-1:00 Providers come to see babies at [facility] Accepting Medicaid for families of first-time babies and families with all children in the household age 3 and under. Must register with office prior to making appointment (M-F only). [facility] Henson, NP; Knapp, MD; Lalonde, MD; Tysinger, PA [address] [place], [place] [address] [phone] Mon-Fri 8:00-5:00 Babies seen by providers at [facility] Does NOT accept Medicaid/Commercial Insurance Only [facility] ([facility])  Artis, MD; Barnes, MD; Bratton, MD; Coccaro, MD; Lockett Gardner, MD; Kramer, MD; Marshall, MD; Netherton, MD; Poleto, MD; Skinner, MD [address] [place], [place] [address] [phone] Mon-Fri 8:30-5:30, Sat (Oct.-Mar.) 9:00-1:00 Babies seen by providers at [facility] Accepting Medicaid  [place] ([address]) [facility] Reid, MD; Warner, MD [address], [place], [place] [address] [phone] Mon-Fri 8:30-5:00, Sat 8:30-12:00 Providers come to see babies at [facility] Does NOT accept Medicaid [facility] Becker, PA; Hagler, MD; Scifres, PA; Sun, MD; Swayne, MD [address], [place],  [address] [phone] Mon-Fri 8:00-5:00 Babies seen by providers at [facility] Does NOT accept Medicaid Only accepting babies of parents who  are patients Please call early in hospitalization for appointment (limited availability) [facility] Clark, MD; Frye, MD; Kelleher, MD; Mack, NP; Miller, MD; O'Keller, MD; Patterson, NP; Pudlo, MD; Puzio, MD; Thomas, MD; Tucker, MD; Twiselton, MD [address], [place], [place] [address] [phone] Mon-Fri 8:00-5:00, Sat 9:00-12:00 Providers come to see babies at [facility] Does NOT accept Medicaid  [place] ([address]) [facility] providers accepting new patients: Brake, NP; Wharton, PA [address], [place], [place] [address] [phone] Mon-Fri 8:00-5:00 Babies seen by providers at [facility] Does NOT accept Medicaid Only accepting babies of parents who are patients Please call early in hospitalization for appointment (limited availability) [facility] Gay, MD; Quinlan, MD [address] [place], [place] [address] [phone] (press [phone] to schedule appointment) Mon-Fri 8:00-5:00 Providers come to see babies at [facility] Does NOT accept Medicaid [facility] Mazer, MD [address] [place], [place] [address] [phone] Mon-Fri 8:30-5:00 (lunch 12:30-1:00), extended hours by appointment only Wed 5:00-6:30 Babies seen by [facility] providers Accepting Medicaid [facility] Banks, MD; Jordan, MD; Koberlein, MD [address], [place], [place] [address] [phone] Mon-Fri 8:00-5:00 Babies seen by [facility] providers Does NOT accept Medicaid [facility] Parker, MD; Hunter, MD; Wallace, DO [address] [place], [place] [address] [phone] Mon-Fri 8:00-5:00 Babies seen by [facility] providers Does NOT accept Medicaid [facility] Brandon, PA; Brecken, PA; Christy, NP; Dees, MD; DeClaire, MD; DeWeese, MD; Hansen, NP; Mills, NP; Parrish, NP; Smoot, NP; Summer, MD; Vapne,  MD [address] [place], [place] [address] [phone] Mon-Fri 8:30-5:00, Sat 10:00-1:00 Providers come to see babies at [facility] Does NOT accept Medicaid Free prenatal information session Tuesdays at 4:45pm [facility] Bouska, MD; Gordon, PA; Jeffery, PA; Weber, PA [address] [place] [address] [phone] Mon-Fri 7:30-5:30 Babies seen by [facility] providers [facility] [address], [place], [place]  [address] [phone]   Fax - [phone]  [place] ([address] & [address]) [facility] [place] [address] [place], [place] [address] [phone] Mon-Thur 8:00-6:00 Providers come to see babies at [facility] Accepting Medicaid [facility] Anderson, NP; Badger, MD; Beal, PA; Spencer, PA [address] [place], [place] [address] [phone] Mon-Thur 7:30-7:30, Fri 7:30-4:30 Babies seen by [facility] providers Accepting Medicaid [facility] Agbuya, MD; Klett, NP; Romgoolam, MD [address], [place], [place] [address] [phone] Mon-Fri 8:30-5:00, Sat 8:30-12:00 Providers come to see babies at [facility] Accepting Medicaid Must have "Meet & Greet" appointment at office prior to delivery [facility] ([facility]) McCord, MD; Wallace, MD; Wood, MD [address], [place], [place] [address] [phone] Mon-Wed 8:00-6:00, Thur-Fri 8:00-5:00, Sat 9:00-12:00 Providers come to see babies at [facility] Does NOT accept Medicaid Only accepting siblings of current patients [facility]  [address], [place], [place]  [address] [phone]   Fax - [phone] [facility] [address], [place], [place]  [address] [phone]   Fax -  [phone]  [place] ([address] & [address]) [facility] Cirigliano, DO; Matthews, DO [address] [place], [place] [address] [phone] Mon-Fri 7:00-5:00 Babies seen by [facility] providers Does NOT accept Medicaid [facility] Briscoe, MD; Howley, PA; Moreira, PA [address], [place], [place] [address] [phone] Mon-Fri 8:00-5:00 Babies seen by [facility] providers Accepting Medicaid [facility] Boyd, MD; Church, PA; Jones, NP; Osborn, PA [address], , [place] [address] [phone] Mon-Fri 8:00-5:00 Babies seen by providers at [facility] Accepting Medicaid  [place] ([address]) [facility] [place] Wendling, DO [address] [place], [place] [address] [phone] Mon-Fri 8:00-5:00 Babies seen by [facility] providers Does NOT accept Medicaid Limited availability, please call early in hospitalization to schedule follow-up [facility] Calderon, PA; Cummings, MD; Dillard, MD; Martin, PA; Olson, MD; VanDeven, PA [address], [place], [place] [address] [phone] Mon-Fri 8:30-5:00, Sat 9:00-12:00 Babies seen by providers at [facility] Accepting Medicaid Please register online then schedule online or call office [email or web address] Hunter, NP; Kumar, MD; Martin Rogers, PA [address], [place], [place] [address] [phone] Mon-Fri 8:00-5:00 Babies seen by providers at [facility] Accepting Medicaid [facility] Pavo, MD; Kristi Fleenor, NP; West, MD [address], [place], [place] [address] [phone] Mon-Fri 8:00-5:30, Sat&Sun by appointment (phones open at  8:30) Babies seen by [facility] providers Accepting Medicaid Must be a first-time baby or sibling of current patient [facility] [place]  [address], [place], [place]  [address] [phone]   Fax - [phone]  [place] ([address]) [facility] Brown, PA; Cowen, PA; Rice, MD; Helton, PA; Spry, MD [address] [place], [place] [address] [phone] Mon-Thur 8:00-7:00, Fri 8:00-5:00, Sat 8:00-12:00, Sun 9:00-12:00 Babies seen by [facility] providers Accepting Medicaid [facility] Coe-Goins, MD; Marshall, MD; Pierre-Louis, MD [address], [place], [place] [address] [phone] Mon-Thur 8:00-5:00 Babies seen by providers at [facility] Accepting Medicaid [facility] Bratton, MD; Coe-Goins, MD; Hayes, MD; Lewis, MD; List, MD; Lott, MD; Marshall, MD; Moran, MD; O'Neal, MD; Pierre-Louis, MD; Pitonzo, MD; Scholer, MD; Spangle, MD [address] [place], [place] [address] [phone] Mon-Fri 8:00-5:30, Sat (Oct.-Mar.) 9:00-1:00 Babies seen by providers at [facility] Accepting Medicaid Must fill out new patient packet, available online at [email or web address] [facility] ([facility]) Friddle, NP; Harris, NP; Kelly, NP; Logan, MD; Melvin, PA; Poth, MD; Ramadoss, MD; Stanton, NP [address], [place], [place] [address] [phone] Mon-Thur 8:00-5:30, Fri 8:00-5:00 Babies seen by providers at [facility] Accepting Medicaid  [place] ([address]) [facility] Dixon, PA; Endwell, MD; Pickard, MD; Tapia, PA [address], [place], [place] [address] [phone] Mon-Fri 8:00-5:00 Babies seen by providers at [facility] Accepting Medicaid   [place] ([address]) [facility] [place] Masneri, DO; Meyers, MD; Nelson, PA [address], [place], [place] [address] [phone] Mon-Fri 8:00-5:00 Babies seen by providers at [facility] Does NOT accept Medicaid Limited appointment availability, please call early in hospitalization  [facility] [place] Kunedd, DO; McGowen, MD [address], [place], [place] [address] [phone] Mon-Fri 8:00-5:00 Babies seen by [facility] providers Does NOT accept Medicaid [facility] [place] Cameron, MD; MacDonald, MD; Michaels, PA; Nayak, MD [address] [place] [address], [place], [place] [address] [phone] Mon-Fri 8:00-5:00 After hours clinic ([address] [place],  [address]) [phone] Mon-Fri 5:00-8:00, Sat 12:00-6:00, Sun 10:00-4:00 Babies seen by [facility] providers Accepting Medicaid [facility] [place] [address], [place], [place]  [address] [phone]   Fax - [phone]  [place] ([address]) [facility] [place] [facility] Andy, MD [address], [place], [place] [address] [phone] Mon-Fri 8:00-5:00 Babies seen by [facility] providers Does NOT accept Medicaid [facility] [place] ([facility] [place]) Eksir, MD [address], [place], [place] [address] [phone] Mon-Thur 8:00-7:00, Fri 8:00-5:00, Sat 8:00-12:00 Babies seen by providers at [facility] Accepting Medicaid - but does not have vaccinations in office (must be received elsewhere) Limited availability, please call early in hospitalization  [place] ([address]) [facility]  Charlene Flemming, MD [address], [place]  [address] [phone]  Fax [phone]  

## 2021-01-16 ENCOUNTER — Other Ambulatory Visit: Payer: Self-pay | Admitting: *Deleted

## 2021-01-16 DIAGNOSIS — Z8759 Personal history of other complications of pregnancy, childbirth and the puerperium: Secondary | ICD-10-CM

## 2021-01-20 ENCOUNTER — Ambulatory Visit: Payer: Medicaid Other | Attending: Maternal & Fetal Medicine

## 2021-01-20 ENCOUNTER — Ambulatory Visit: Payer: Medicaid Other | Admitting: *Deleted

## 2021-01-20 ENCOUNTER — Encounter: Payer: Self-pay | Admitting: *Deleted

## 2021-01-20 ENCOUNTER — Other Ambulatory Visit: Payer: Self-pay

## 2021-01-20 VITALS — BP 126/83 | HR 95

## 2021-01-20 DIAGNOSIS — Z3401 Encounter for supervision of normal first pregnancy, first trimester: Secondary | ICD-10-CM

## 2021-01-20 DIAGNOSIS — O365930 Maternal care for other known or suspected poor fetal growth, third trimester, not applicable or unspecified: Secondary | ICD-10-CM | POA: Insufficient documentation

## 2021-01-26 ENCOUNTER — Inpatient Hospital Stay (HOSPITAL_COMMUNITY)
Admission: AD | Admit: 2021-01-26 | Discharge: 2021-01-26 | Disposition: A | Discharge status: Home / Self Care | Payer: Medicaid Other | Source: Ambulatory Visit | Attending: Obstetrics & Gynecology | Admitting: Obstetrics & Gynecology

## 2021-01-26 ENCOUNTER — Encounter (HOSPITAL_COMMUNITY): Payer: Self-pay | Admitting: Obstetrics & Gynecology

## 2021-01-26 DIAGNOSIS — Z3A34 34 weeks gestation of pregnancy: Secondary | ICD-10-CM

## 2021-01-26 DIAGNOSIS — M549 Dorsalgia, unspecified: Secondary | ICD-10-CM

## 2021-01-26 DIAGNOSIS — R102 Pelvic and perineal pain: Secondary | ICD-10-CM | POA: Diagnosis not present

## 2021-01-26 DIAGNOSIS — M545 Low back pain, unspecified: Secondary | ICD-10-CM | POA: Diagnosis not present

## 2021-01-26 DIAGNOSIS — R109 Unspecified abdominal pain: Secondary | ICD-10-CM

## 2021-01-26 DIAGNOSIS — O99891 Other specified diseases and conditions complicating pregnancy: Secondary | ICD-10-CM

## 2021-01-26 DIAGNOSIS — O26893 Other specified pregnancy related conditions, third trimester: Secondary | ICD-10-CM | POA: Insufficient documentation

## 2021-01-26 DIAGNOSIS — R42 Dizziness and giddiness: Secondary | ICD-10-CM | POA: Diagnosis not present

## 2021-01-26 DIAGNOSIS — N858 Other specified noninflammatory disorders of uterus: Secondary | ICD-10-CM | POA: Insufficient documentation

## 2021-01-26 DIAGNOSIS — Z79899 Other long term (current) drug therapy: Secondary | ICD-10-CM | POA: Diagnosis not present

## 2021-01-26 LAB — URINALYSIS, ROUTINE W REFLEX MICROSCOPIC
Bilirubin Urine: NEGATIVE
Glucose, UA: NEGATIVE mg/dL
Hgb urine dipstick: NEGATIVE
Ketones, ur: NEGATIVE mg/dL
Nitrite: NEGATIVE
Protein, ur: NEGATIVE mg/dL
Specific Gravity, Urine: 1.013 (ref 1.005–1.030)
pH: 7 (ref 5.0–8.0)

## 2021-01-26 MED ORDER — CYCLOBENZAPRINE HCL 5 MG PO TABS
5.0000 mg | ORAL_TABLET | Freq: Once | ORAL | Status: AC
  Administered 2021-01-26: 5 mg via ORAL
  Filled 2021-01-26: qty 1

## 2021-01-26 MED ORDER — CYCLOBENZAPRINE HCL 5 MG PO TABS: 5.0000 mg | ORAL_TABLET | Freq: Three times a day (TID) | ORAL | 0 refills | Status: DC | PRN

## 2021-01-26 MED ORDER — NIFEDIPINE 10 MG PO CAPS
10.0000 mg | ORAL_CAPSULE | ORAL | Status: DC | PRN
  Administered 2021-01-26: 10 mg via ORAL
  Filled 2021-01-26: qty 1

## 2021-01-26 NOTE — MAU Note (Signed)
Pt reports she started having sharp pain in her pelvis that wrap around to her back Reports  she had "tunnel visin earlier today. Denies any vag bleeding or leaking at this time. Good fetal movement reported.
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
## 2021-01-27 ENCOUNTER — Other Ambulatory Visit: Payer: Self-pay

## 2021-01-27 ENCOUNTER — Ambulatory Visit (INDEPENDENT_AMBULATORY_CARE_PROVIDER_SITE_OTHER): Payer: Medicaid Other | Admitting: Nurse Practitioner

## 2021-01-27 VITALS — BP 120/75 | HR 83 | Wt 159.0 lb

## 2021-01-27 DIAGNOSIS — Z3401 Encounter for supervision of normal first pregnancy, first trimester: Secondary | ICD-10-CM

## 2021-01-27 DIAGNOSIS — Z3A34 34 weeks gestation of pregnancy: Secondary | ICD-10-CM

## 2021-01-27 DIAGNOSIS — F411 Generalized anxiety disorder: Secondary | ICD-10-CM

## 2021-01-27 DIAGNOSIS — M549 Dorsalgia, unspecified: Secondary | ICD-10-CM

## 2021-01-27 DIAGNOSIS — O99891 Other specified diseases and conditions complicating pregnancy: Secondary | ICD-10-CM

## 2021-01-27 NOTE — Progress Notes (Signed)
    Subjective:  Anita Hoover is a 25 y.o. G1P0000 at [redacted]w[redacted]d being seen today for ongoing prenatal care.  She is currently monitored for the following issues for this low-risk pregnancy and has HTN (hypertension); Acne; GAD (generalized anxiety disorder); Panic disorder; Encounter for supervision of normal first pregnancy in first trimester; Rh negative, antepartum; and Small for gestational age (SGA) on their problem list.  Patient reports backache and vomiting this morning.  Contractions: Irritability. Vag. Bleeding: None.  Movement: Present. Denies leaking of fluid.   The following portions of the patient's history were reviewed and updated as appropriate: allergies, current medications, past family history, past medical history, past social history, past surgical history and problem list. Problem list updated.  Objective:   Vitals:   01/27/21 1128  BP: 120/75  Pulse: 83  Weight: 159 lb (72.1 kg)    Fetal Status: Fetal Heart Rate (bpm): 134 Fundal Height: 34 cm Movement: Present     General:  Alert, oriented and cooperative. Patient is in no acute distress.  Skin: Skin is warm and dry. No rash noted.   Cardiovascular: Normal heart rate noted  Respiratory: Normal respiratory effort, no problems with respiration noted  Abdomen: Soft, gravid, appropriate for gestational age. Pain/Pressure: Present     Pelvic:  Cervical exam deferred        Extremities: Normal range of motion.  Edema: None  Mental Status: Normal mood and affect. Normal behavior. Normal judgment and thought content.   Urinalysis:      Assessment and Plan:  Pregnancy: G1P0000 at [redacted]w[redacted]d  1. Encounter for supervision of normal first pregnancy in first trimester Seen in [facility] yesterday for back pain and lower abdominal pain - is much less but not completely resolved Advised to take tylenol for back pain as needed Advised to call next week for an extra appt if needed Is working on Pediatrician - has called several offices  and was told to call back when she had the baby Still undecided on contraception Advised to take medication she says she has at home for nausea as she has had vomting today.  2. GAD (generalized anxiety disorder) Doing well. Has PRN meds to use if needed  3. Back pain affecting pregnancy in third trimester Seen at [facility] - note reviewed Was given Flexeril for back pain and also one dose of procardia - cervix was closed and thick  Preterm labor symptoms and general obstetric precautions including but not limited to vaginal bleeding, contractions, leaking of fluid and fetal movement were reviewed in detail with the patient. Please refer to After Visit Summary for other counseling recommendations.  Return in about 2 weeks (around 02/10/2021) for in person ROB for vaginal swabs.  Nolene Bernheim, RN, MSN, NP-BC Nurse Practitioner, [facility] for [employer], [facility] 01/27/2021 12:01 PM

## 2021-02-08 ENCOUNTER — Other Ambulatory Visit: Payer: Self-pay

## 2021-02-08 ENCOUNTER — Encounter: Payer: Self-pay | Admitting: Women's Health

## 2021-02-08 ENCOUNTER — Other Ambulatory Visit (HOSPITAL_COMMUNITY)
Admission: RE | Admit: 2021-02-08 | Discharge: 2021-02-08 | Disposition: A | Discharge status: Home / Self Care | Payer: Medicaid Other | Source: Ambulatory Visit | Attending: Women's Health | Admitting: Women's Health

## 2021-02-08 ENCOUNTER — Ambulatory Visit (INDEPENDENT_AMBULATORY_CARE_PROVIDER_SITE_OTHER): Payer: Medicaid Other | Admitting: Women's Health

## 2021-02-08 VITALS — BP 125/83 | HR 87 | Wt 164.0 lb

## 2021-02-08 DIAGNOSIS — O26899 Other specified pregnancy related conditions, unspecified trimester: Secondary | ICD-10-CM

## 2021-02-08 DIAGNOSIS — Z3401 Encounter for supervision of normal first pregnancy, first trimester: Secondary | ICD-10-CM | POA: Diagnosis not present

## 2021-02-08 DIAGNOSIS — F411 Generalized anxiety disorder: Secondary | ICD-10-CM

## 2021-02-08 DIAGNOSIS — I10 Essential (primary) hypertension: Secondary | ICD-10-CM

## 2021-02-08 DIAGNOSIS — Z6791 Unspecified blood type, Rh negative: Secondary | ICD-10-CM

## 2021-02-08 DIAGNOSIS — Z3A36 36 weeks gestation of pregnancy: Secondary | ICD-10-CM

## 2021-02-08 DIAGNOSIS — F410 Panic disorder [episodic paroxysmal anxiety] without agoraphobia: Secondary | ICD-10-CM

## 2021-02-08 NOTE — Progress Notes (Signed)
ROB [redacted]w[redacted]d   GBS today.   YK:DXIPJASN and braxton hicks ctx's Pt would like cervix check   PT CONSENTS TO STUDENT IN EXAM RM.

## 2021-02-08 NOTE — Patient Instructions (Addendum)
Maternity Assessment Unit (MAU)  The Maternity Assessment Unit (MAU) is located at the [facility] The address is: [address] [place], [place], [place] [address]. Please see map below for additional directions.    The Maternity Assessment Unit is designed to help you during your pregnancy, and for up to 6 weeks after delivery, with any pregnancy- or postpartum-related emergencies, if you think you are in labor, or if your water has broken. For example, if you experience nausea and vomiting, vaginal bleeding, severe abdominal or pelvic pain, elevated blood pressure or other problems related to your pregnancy or postpartum time, please come to the Maternity Assessment Unit for assistance.       Contraception Choices - www.bedsider.org Contraception, also called birth control, refers to methods or devices thatprevent pregnancy. Hormonal methods  Contraceptive implant A contraceptive implant is a thin, plastic tube that contains a hormone that prevents pregnancy. It is different from an intrauterine device (IUD). It is inserted into the upper part of the arm by a health care provider. Implants canbe effective for up to 3 years. Progestin-only injections Progestin-only injections are injections of progestin, a synthetic form of thehormone progesterone. They are given every 3 months by a health care provider. Birth control pills Birth control pills are pills that contain hormones that prevent pregnancy. They must be taken once a day, preferably at the same time each day. Aprescription is needed to use this method of contraception. Birth control patch The birth control patch contains hormones that prevent pregnancy. It is placed on the skin and must be changed once a week for three weeks and removed on thefourth week. A prescription is needed to use this method of contraception. Vaginal ring A vaginal ring contains hormones that prevent  pregnancy. It is placed in the vagina for three weeks and removed on the fourth week. After that, the process is repeated with a new ring. A prescription is needed to use this method ofcontraception. Emergency contraceptive Emergency contraceptives prevent pregnancy after unprotected sex. They come in pill form and can be taken up to 5 days after sex. They work best the sooner they are taken after having sex. Most emergency contraceptives are available without a prescription. This method should not be used as your only form ofbirth control. Barrier methods  Female condom A female condom is a thin sheath that is worn over the penis during sex. Condoms keep sperm from going inside a woman's body. They can be used with a sperm-killing substance (spermicide) to increase their effectiveness. They should be thrown away after one use. Female condom A female condom is a soft, loose-fitting sheath that is put into the vagina before sex. The condom keeps sperm from going inside a woman's body. Theyshould be thrown away after one use. Diaphragm A diaphragm is a soft, dome-shaped barrier. It is inserted into the vagina before sex, along with a spermicide. The diaphragm blocks sperm from entering the uterus, and the spermicide kills sperm. A diaphragm should be left in thevagina for 6-8 hours after sex and removed within 24 hours. A diaphragm is prescribed and fitted by a health care provider. A diaphragm should be replaced every 1-2 years, after giving birth, after gaining more than15 lb (6.8 kg), and after pelvic surgery. Cervical cap A cervical cap is a round, soft latex or plastic cup that fits over the cervix. It is inserted into the vagina before sex, along with spermicide. It blocks sperm from entering  the uterus. The cap should be left in place for 6-8 hours after sex and removed within 48 hours. A cervical cap must be prescribed andfitted by a health care provider. It should be replaced every 2  years. Sponge A sponge is a soft, circular piece of polyurethane foam with spermicide in it. The sponge helps block sperm from entering the uterus, and the spermicide kills sperm. To use it, you make it wet and then insert it into the vagina. It should be inserted before sex, left in for at least 6 hours after sex, and removed andthrown away within 30 hours. Spermicides Spermicides are chemicals that kill or block sperm from entering the cervix and uterus. They can come as a cream, jelly, suppository, foam, or tablet. A spermicide should be inserted into the vagina with an applicator at least 10-15 minutes before sex to allow time for it to work. The process must be repeatedevery time you have sex. Spermicides do not require a prescription. Intrauterine contraception Intrauterine device (IUD) An IUD is a T-shaped device that is put in a woman's uterus. There are two types: Hormone IUD.This type contains progestin, a synthetic form of the hormone progesterone. This type can stay in place for 3-5 years. Copper IUD.This type is wrapped in copper wire. It can stay in place for 10 years. Permanent methods of contraception Female tubal ligation In this method, a woman's fallopian tubes are sealed, tied, or blocked duringsurgery to prevent eggs from traveling to the uterus. Hysteroscopic sterilization In this method, a small, flexible insert is placed into each fallopian tube. The inserts cause scar tissue to form in the fallopian tubes and block them, so sperm cannot reach an egg. The procedure takes about 3 months to be effective.Another form of birth control must be used during those 3 months. Female sterilization This is a procedure to tie off the tubes that carry sperm (vasectomy). After the procedure, the man can still ejaculate fluid (semen). Another form of birth control must be used for 3 months after the procedure. Natural planning methods Natural family planning In this method, a couple does not  have sex on days when the woman could become pregnant. Calendar method In this method, the woman keeps track of the length of each menstrual cycle, identifies the days when pregnancy can happen, and does not have sex on those days. Ovulation method In this method, a couple avoids sex during ovulation. Symptothermal method This method involves not having sex during ovulation. The woman typically checks for ovulation bywatching changes in her temperature and in the consistency of cervical mucus. Post-ovulation method In this method, a couple waits to have sex until after ovulation. Where to find more information Centers for Disease Control and Prevention: FootballExhibition.com.br Summary Contraception, also called birth control, refers to methods or devices that prevent pregnancy. Hormonal methods of contraception include implants, injections, pills, patches, vaginal rings, and emergency contraceptives. Barrier methods of contraception can include female condoms, female condoms, diaphragms, cervical caps, sponges, and spermicides. There are two types of IUDs (intrauterine devices). An IUD can be put in a woman's uterus to prevent pregnancy for 3-5 years. Permanent sterilization can be done through a procedure for males and females. Natural family planning methods involve nothaving sex on days when the woman could become pregnant. This information is not intended to replace advice given to you by your health care provider. Make sure you discuss any questions you have with your healthcare provider. Document Revised: 12/21/2019 Document Reviewed: 12/21/2019 Elsevier Patient  Education  2022 Elsevier Inc.       AREA PEDIATRIC/FAMILY PRACTICE PHYSICIANS  [facility] [address] [place], [place] [address] Phone - [phone]   Fax - [phone]  JACK AMOS [address] [place], [place]  [address] Phone - [phone]   Fax - [phone]  [facility] [address] [place], [place]  [address] Phone - [phone]   Fax - [phone]  [facility] [address] [place], [place]  [address] Phone - [phone]   Fax - [phone]  [facility] [address] [place], [place]  [address] Phone - [phone]   Fax - [phone]  [facility] [address], [address] [place], [place]  [address] Phone - [phone]   Fax - [phone]  [facility] [address] [place], [place]  [address] Phone - [phone]   Fax - [phone]  [facility] AT [facility] [address] [place], [address] [place], [place]  [address] Phone - [phone]   Fax - [phone]  [facility] AT [facility] [address] [place], [place]  [address] Phone - [phone]   Fax - [phone] [facility] AT [place] [address] [place], [place]  [address] Phone - [phone]   Fax - [phone]  [facility] [address] [place], [place]  [address] Phone - [phone]   Fax - [phone]  [facility] [address], [address] [place], [place]  [address] Phone - [phone]   Fax - [phone]  [facility] [place] [address] [place], [place]  [address] Phone - [phone]   Fax - [phone]  [facility] [address] [place], [place]  [address] Phone - [phone]  [facility] [address] [place], [place]  [address] Phone - [phone]   Fax - [phone]  [facility] [address] [place], [place]  [address] Phone - [phone]   Fax - [phone]  [facility] [address] [place], [place]  [address] Phone - [phone]   Fax - [phone]  [facility] [address] [place], [place]  [address] Phone - [phone]   Fax  - [phone]   [facility] [address] [place], [place]  [address] Phone - [phone]   Fax - [phone]  [facility] [address] [place], [place]  [address] Phone - [phone]   Fax - [phone]  DAVID RUBIN [address] [place], [place]  [address] Phone - [phone]   Fax - [phone]  [facility] [address] [place], [place]  [address] Phone - [phone]   Fax - [phone]  [facility] Al[facility] Chyle [address] [place], [place]  [address] Phone - [phone]   Fax - [phone] Gerarda Fraction [address] [place], [place]  [address] Phone - [phone]   Fax - [phone]  [facility] [address] [place], [place]  [address] Phone - [phone]   Fax - [phone]  [facility] [place] [address] [place], [place]  [address] Phone - [phone]   Fax - [phone]        Signs and Symptoms of Labor Labor is the body's natural process of moving the baby and  the placenta out of the uterus. The process of labor usually starts when the baby is full-term,between 37 and 40 weeks of pregnancy. Signs and symptoms that you are close to going into labor As your body prepares for labor and the birth of your baby, you may notice the following symptoms in the weeks and days before true labor starts: Passing a small amount of thick, bloody mucus from your vagina. This is called normal bloody show or losing your mucus plug. This may happen more than a week before labor begins, or right before labor begins, as the opening of the cervix starts to widen (dilate). For some women, the entire mucus plug passes at once. For others, pieces of the mucus plug may gradually pass over several days. Your baby moving (dropping) lower in your pelvis to get into position for birth (lightening). When this  happens, you may feel more pressure on your bladder and pelvic bone and less pressure on your ribs. This may make it easier to breathe. It may also cause you to need to urinate more often and have problems with bowel movements. Having "practice contractions," also called Braxton Hicks contractions or false labor. These occur at irregular (unevenly spaced) intervals that are more than 10 minutes apart. False labor contractions are common after exercise or sexual activity. They will stop if you change position, rest, or drink fluids. These contractions are usually mild and do not get stronger over time. They may feel like: A backache or back pain. Mild cramps, similar to menstrual cramps. Tightening or pressure in your abdomen. Other early symptoms include: Nausea or loss of appetite. Diarrhea. Having a sudden burst of energy, or feeling very tired. Mood changes. Having trouble sleeping. Signs and symptoms that labor has begun Signs that you are in labor may include: Having contractions that come at regular (evenly spaced) intervals and increase in intensity. This may feel like more intense tightening or pressure in your abdomen that moves to your back. Contractions may also feel like rhythmic pain in your upper thighs or back that comes and goes at regular intervals. For first-time mothers, this change in intensity of contractions often occurs at a more gradual pace. Women who have given birth before may notice a more rapid progression of contraction changes. Feeling pressure in the vaginal area. Your water breaking (rupture of membranes). This is when the sac of fluid that surrounds your baby breaks. Fluid leaking from your vagina may be clear or blood-tinged. Labor usually starts within 24 hours of your water breaking, but it may take longer to begin. Some women may feel a sudden gush of fluid. Others notice that their underwear repeatedly becomes damp. Follow these instructions at  home:  When labor starts, or if your water breaks, call your health care provider or nurse care line. Based on your situation, they will determine when you should go in for an exam. During early labor, you may be able to rest and manage symptoms at home. Some strategies to try at home include: Breathing and relaxation techniques. Taking a warm bath or shower. Listening to music. Using a heating pad on the lower back for pain. If you are directed to use heat: Place a towel between your skin and the heat source. Leave the heat on for 20-30 minutes. Remove the heat if your skin turns bright red. This is especially important if you are unable to feel pain, heat, or cold. You may have a greater risk of getting burned. Contact a  health care provider if: Your labor has started. Your water breaks. Get help right away if: You have painful, regular contractions that are 5 minutes apart or less. Labor starts before you are [redacted] weeks along in your pregnancy. You have a fever. You have bright red blood coming from your vagina. You do not feel your baby moving. You have a severe headache with or without vision problems. You have severe nausea, vomiting, or diarrhea. You have chest pain or shortness of breath. These symptoms may represent a serious problem that is an emergency. Do not wait to see if the symptoms will go away. Get medical help right away. Call your local emergency services (911 in the [place] Do not drive yourself to the hospital. Summary Labor is your body's natural process of moving your baby and the placenta out of your uterus. The process of labor usually starts when your baby is full-term, between 91 and 40 weeks of pregnancy. When labor starts, or if your water breaks, call your health care provider or nurse care line. Based on your situation, they will determine when you should go in for an exam. This information is not intended to replace advice given to you by your health care  provider. Make sure you discuss any questions you have with your healthcare provider. Document Revised: 05/07/2020 Document Reviewed: 05/07/2020 Elsevier Patient Education  2022 Elsevier Inc.       Group B Streptococcus Test During Pregnancy Why am I having this test? Routine testing, also called screening, for group B streptococcus (GBS) is recommended for all pregnant women between the 36th and 37th week of pregnancy. GBS is a type of bacteria that can be passed from mother to baby during childbirth. Screening will help guide whether or not you will need treatment during labor and delivery to prevent complications such as: An infection in your uterus during labor. An infection in your uterus after delivery. A serious infection in your baby after delivery, such as pneumonia, meningitis, or sepsis. GBS screening is not often done before 36 weeks of pregnancy unless you go intolabor prematurely. What happens if I have group B streptococcus? If testing shows that you have GBS, your health care provider will recommend treatment with IV antibiotics during labor and delivery. This treatmentsignificantly decreases the risk of complications for you and your baby. If you have a planned C-section and you have GBS, you may not need to be treated with antibiotics because GBS is usually passed to babies after labor starts and your water breaks. If you are in labor or your water breaks before your C-section, it is possible for GBS to get into your uterus and be passed toyour baby, so you might need treatment. Is there a chance I may not need to be tested? You may not need to be tested for GBS if: You have a urine test that shows GBS before 36 to 37 weeks. You had a baby with GBS infection after a previous delivery. In these cases, you will automatically be treated for GBS during labor anddelivery. What is being tested? This test is done to check if you have group B streptococcus in your vagina  orrectum. What kind of sample is taken? To collect samples for this test, your health care provider will swab your vagina and rectum with a cotton swab. The sample is then sent to the lab to seeif GBS is present. What happens during the test?  You will remove your clothing from the waist down. You will  lie down on an exam table in the same position as you would for a pelvic exam. Your health care provider will swab your vagina and rectum to collect samples for a culture test. You will be able to go home after the test and do all your usual activities. How are the results reported? The test results are reported as positive or negative. What do the results mean? A positive test means you are at risk for passing GBS to your baby during labor and delivery. Your health care provider will recommend that you are treated with an IV antibiotic during labor and delivery. A negative test means you are at very low risk of passing GBS to your baby. There is still a low risk of passing GBS to your baby because sometimes test results may report that you do not have a condition when you do (false-negative result) or there is a chance that you may become infected with GBS after the test is done. You most likely will not need to be treated with an antibiotic during labor and delivery. Talk with your health care provider about what your results mean. Questions to ask your health care provider Ask your health care provider, or the department that is doing the test: When will my results be ready? How will I get my results? What are my treatment options? Summary Routine testing (screening) for group B streptococcus (GBS) is recommended for all pregnant women between the 36th and 37th week of pregnancy. GBS is a type of bacteria that can be passed from mother to baby during childbirth. If testing shows that you have GBS, your health care provider will recommend that you are treated with IV antibiotics during labor  and delivery. This treatment almost always prevents infection in newborns. This information is not intended to replace advice given to you by your health care provider. Make sure you discuss any questions you have with your healthcare provider. Document Revised: 05/17/2020 Document Reviewed: 08/13/2018 Elsevier Patient Education  2022 Elsevier Inc.       Hypertension During Pregnancy High blood pressure (hypertension) is when the force of blood pumping through the arteries is high enough to cause problems with your health. Arteries are blood vessels that carry blood from the heart throughout the body. Hypertension during pregnancy can causeproblems for you and your baby. It can be mild or severe. There are different types of hypertension that can happen during pregnancy. These include: Chronic hypertension. This happens when you had high blood pressure before you became pregnant, and it continues during the pregnancy. Hypertension that develops before you are [redacted] weeks pregnant and continues during the pregnancy is also called chronic hypertension. If you have chronic hypertension, it will not go away after you have your baby. You will need follow-up visits with your health care provider after you have your baby. Your health care provider may want you to keep taking medicine for your blood pressure. Gestational hypertension. This is hypertension that develops after the 20th week of pregnancy. Gestational hypertension usually goes away after you have your baby, but your health care provider will need to monitor your blood pressure to make sure that it is getting better. Postpartum hypertension. This is high blood pressure that was present before delivery and continues after delivery or that starts after delivery. This usually occurs within 48 hours after childbirth but may occur up to 6 weeks after giving birth. When hypertension during pregnancy is severe, it is a medical emergency thatrequires  treatment right  away. How does this affect me? Women who have hypertension during pregnancy have a greater chance of developing hypertension later in life or during future pregnancies. In some cases, hypertension during pregnancy can cause serious complications, such as: Stroke. Heart attack. Injury to other organs, such as kidneys, lungs, or liver. Preeclampsia. A condition called hemolysis, elevated liver enzymes, and low platelet count (HELLP) syndrome. Convulsions or seizures. Placental abruption. How does this affect my baby? Hypertension during pregnancy can affect your baby. Your baby may: Be born early (prematurely). Not weigh as much as he or she should at birth (low birth weight). Not tolerate labor well, leading to an unplanned cesarean delivery. This condition may also result in a baby's death before birth (stillbirth). What are the risks? There are certain factors that make it more likely for you to develop hypertension during pregnancy. These include: Having hypertension during a previous pregnancy or a family history of hypertension. Being overweight. Being age 23 or older. Being pregnant for the first time. Being pregnant with more than one baby. Becoming pregnant using fertilization methods, such as IVF (in vitro fertilization). Having other medical problems, such as diabetes, kidney disease, or lupus. What can I do to lower my risk? The exact cause of hypertension during pregnancy is not known. You may be able to lower your risk by: Maintaining a healthy weight. Eating a healthy and balanced diet. Following your health care provider's instructions about treating any long-term conditions that you had before becoming pregnant. It is very important to keep all of your prenatal care appointments. Your health care provider will check your blood pressure and make sure that your pregnancy is progressing as expected. If a problem is found, early treatmentcan prevent  complications. How is this treated? Treatment for hypertension during pregnancy varies depending on the type of hypertension you have and how serious it is. If you were taking medicine for high blood pressure before you became pregnant, talk with your health care provider. You may need to change medicine during pregnancy because some medicines, like ACE inhibitors, may not be considered safe for your baby. If you have gestational hypertension, your health care provider may order medicine to treat this during pregnancy. If you are at risk for preeclampsia, your health care provider may recommend that you take a low-dose aspirin during your pregnancy. If you have severe hypertension, you may need to be hospitalized so you and your baby can be monitored closely. You may also need to be given medicine to lower your blood pressure. In some cases, if your condition gets worse, you may need to deliver your baby early. Follow these instructions at home: Eating and drinking  Drink enough fluid to keep your urine pale yellow. Avoid caffeine.  Lifestyle Do not use any products that contain nicotine or tobacco. These products include cigarettes, chewing tobacco, and vaping devices, such as e-cigarettes. If you need help quitting, ask your health care provider. Do not use alcohol or drugs. Avoid stress as much as possible. Rest and get plenty of sleep. Regular exercise can help to reduce your blood pressure. Ask your health care provider what kinds of exercise are best for you. General instructions Take over-the-counter and prescription medicines only as told by your health care provider. Keep all prenatal and follow-up visits. This is important. Contact a health care provider if: You have symptoms that your health care provider told you may require more treatment or monitoring, such as: Headaches. Nausea or vomiting. Abdominal pain. Dizziness. Light-headedness. Get  help right away if: You have  symptoms of serious complications, such as: Severe abdominal pain that does not get better with treatment. A severe headache that does not get better, blurred vision, or double vision. Vomiting that does not get better. Sudden, rapid weight gain or swelling in your hands, ankles, or face. Vaginal bleeding. Blood in your urine. Shortness of breath or chest pain. Weakness on one side of your body or difficulty speaking. Your baby is not moving as much as usual. These symptoms may represent a serious problem that is an emergency. Do not wait to see if the symptoms will go away. Get medical help right away. Call your local emergency services (911 in the [place] Do not drive yourself to the hospital. Summary Hypertension during pregnancy can cause problems for you and your baby. Treatment for hypertension during pregnancy varies depending on the type of hypertension you have and how serious it is. Keep all prenatal and follow-up visits. This is important. Get help right away if you have symptoms of serious complications related to high blood pressure. This information is not intended to replace advice given to you by your health care provider. Make sure you discuss any questions you have with your healthcare provider. Document Revised: 04/07/2020 Document Reviewed: 04/07/2020 Elsevier Patient Education  2022 [employer].

## 2021-02-08 NOTE — Progress Notes (Signed)
Subjective:  Anita Hoover is a 25 y.o. G1P0000 at [redacted]w[redacted]d being seen today for ongoing prenatal care.  She is currently monitored for the following issues for this high-risk pregnancy and has HTN (hypertension); Acne; GAD (generalized anxiety disorder); Panic disorder; Encounter for supervision of normal first pregnancy in first trimester; Rh negative, antepartum; and Small for gestational age (SGA) on their problem list.  Patient reports no complaints.  Contractions: Irritability. Vag. Bleeding: None.  Movement: Present. Denies leaking of fluid.   The following portions of the patient's history were reviewed and updated as appropriate: allergies, current medications, past family history, past medical history, past social history, past surgical history and problem list. Problem list updated.  Objective:   Vitals:   02/08/21 1052  BP: 125/83  Pulse: 87  Weight: 164 lb (74.4 kg)    Fetal Status: Fetal Heart Rate (bpm): 140   Movement: Present     General:  Alert, oriented and cooperative. Patient is in no acute distress.  Skin: Skin is warm and dry. No rash noted.   Cardiovascular: Normal heart rate noted  Respiratory: Normal respiratory effort, no problems with respiration noted  Abdomen: Soft, gravid, appropriate for gestational age. Pain/Pressure: Present     Pelvic: Vag. Bleeding: None     Cervical exam deferred        Extremities: Normal range of motion.  Edema: None  Mental Status: Normal mood and affect. Normal behavior. Normal judgment and thought content.   Urinalysis:      Assessment and Plan:  Pregnancy: G1P0000 at [redacted]w[redacted]d  1. Encounter for supervision of normal first pregnancy in first trimester -discussed contraception, info given -peds list given  2. Small for gestational age (SGA) -next [place] 02/10/2021  3. Rh negative, antepartum  4. Hypertension, unspecified type -BP today 125/83 -next [place] BPP 02/10/2021  5. [redacted] weeks gestation of pregnancy  Term labor symptoms  and general obstetric precautions including but not limited to vaginal bleeding, contractions, leaking of fluid and fetal movement were reviewed in detail with the patient. I discussed the assessment and treatment plan with the patient. The patient was provided an opportunity to ask questions and all were answered. The patient agreed with the plan and demonstrated an understanding of the instructions. The patient was advised to call back or seek an in-person office evaluation/go to [facility]U at [facility] for any urgent or concerning symptoms. Please refer to After Visit Summary for other counseling recommendations.  Return in about 1 week (around 02/15/2021) for in-person HOB/APP OK.   Haruto Demaria, Odie Sera, NP

## 2021-02-09 LAB — CERVICOVAGINAL ANCILLARY ONLY
Chlamydia: NEGATIVE
Comment: NEGATIVE
Comment: NEGATIVE
Comment: NORMAL
Neisseria Gonorrhea: NEGATIVE
Trichomonas: NEGATIVE

## 2021-02-10 ENCOUNTER — Encounter: Payer: Self-pay | Admitting: *Deleted

## 2021-02-10 ENCOUNTER — Other Ambulatory Visit: Payer: Self-pay

## 2021-02-10 ENCOUNTER — Ambulatory Visit: Payer: Medicaid Other | Attending: Obstetrics

## 2021-02-10 ENCOUNTER — Ambulatory Visit: Payer: Medicaid Other | Admitting: *Deleted

## 2021-02-10 ENCOUNTER — Other Ambulatory Visit: Payer: Self-pay | Admitting: Obstetrics

## 2021-02-10 VITALS — BP 130/86 | HR 72

## 2021-02-10 DIAGNOSIS — Z8759 Personal history of other complications of pregnancy, childbirth and the puerperium: Secondary | ICD-10-CM

## 2021-02-10 DIAGNOSIS — O365930 Maternal care for other known or suspected poor fetal growth, third trimester, not applicable or unspecified: Secondary | ICD-10-CM | POA: Diagnosis not present

## 2021-02-10 DIAGNOSIS — Z3401 Encounter for supervision of normal first pregnancy, first trimester: Secondary | ICD-10-CM

## 2021-02-10 DIAGNOSIS — O365990 Maternal care for other known or suspected poor fetal growth, unspecified trimester, not applicable or unspecified: Secondary | ICD-10-CM | POA: Insufficient documentation

## 2021-02-10 DIAGNOSIS — Z3A36 36 weeks gestation of pregnancy: Secondary | ICD-10-CM

## 2021-02-10 DIAGNOSIS — Z362 Encounter for other antenatal screening follow-up: Secondary | ICD-10-CM

## 2021-02-10 DIAGNOSIS — O10013 Pre-existing essential hypertension complicating pregnancy, third trimester: Secondary | ICD-10-CM | POA: Diagnosis not present

## 2021-02-10 LAB — STREP GP B NAA: Strep Gp B NAA: POSITIVE — AB

## 2021-02-10 NOTE — Procedures (Signed)
Anita Hoover 1995/08/15 [redacted]w[redacted]d  Fetus A Non-Stress Test Interpretation for 02/10/21  Indication: Unsatisfactory BPP  Fetal Heart Rate A Mode: External Baseline Rate (A): 130 bpm Variability: Moderate Accelerations: 15 x 15 Decelerations: None Multiple birth?: No  Uterine Activity Mode: Palpation, Toco Contraction Frequency (min): Occas w/UI Contraction Quality: Mild Resting Tone Palpated: Relaxed Resting Time: Adequate  Interpretation (Fetal Testing) Nonstress Test Interpretation: Reactive Comments: Dr. Judeth Cornfield reviewed tracing.

## 2021-02-15 ENCOUNTER — Other Ambulatory Visit: Payer: Self-pay

## 2021-02-15 ENCOUNTER — Ambulatory Visit (INDEPENDENT_AMBULATORY_CARE_PROVIDER_SITE_OTHER): Payer: Medicaid Other | Admitting: Family Medicine

## 2021-02-15 ENCOUNTER — Encounter: Payer: Self-pay | Admitting: Family Medicine

## 2021-02-15 VITALS — BP 125/76 | HR 82 | Wt 167.0 lb

## 2021-02-15 DIAGNOSIS — O26899 Other specified pregnancy related conditions, unspecified trimester: Secondary | ICD-10-CM

## 2021-02-15 DIAGNOSIS — Z3401 Encounter for supervision of normal first pregnancy, first trimester: Secondary | ICD-10-CM

## 2021-02-15 DIAGNOSIS — Z6791 Unspecified blood type, Rh negative: Secondary | ICD-10-CM

## 2021-02-15 DIAGNOSIS — I10 Essential (primary) hypertension: Secondary | ICD-10-CM

## 2021-02-15 NOTE — Patient Instructions (Signed)

## 2021-02-15 NOTE — Progress Notes (Signed)
   Subjective:  Anita Hoover is a 25 y.o. G1P0000 at [redacted]w[redacted]d being seen today for ongoing prenatal care.  She is currently monitored for the following issues for this low-risk pregnancy and has HTN (hypertension); Acne; GAD (generalized anxiety disorder); Panic disorder; Encounter for supervision of normal first pregnancy in first trimester; Rh negative, antepartum; and Small for gestational age (SGA) on their problem list.  Patient reports no complaints.  Contractions: Irritability. Vag. Bleeding: None.  Movement: Present. Denies leaking of fluid.   The following portions of the patient's history were reviewed and updated as appropriate: allergies, current medications, past family history, past medical history, past social history, past surgical history and problem list. Problem list updated.  Objective:   Vitals:   02/15/21 1130  BP: 125/76  Pulse: 82  Weight: 167 lb (75.8 kg)    Fetal Status: Fetal Heart Rate (bpm): 140   Movement: Present     General:  Alert, oriented and cooperative. Patient is in no acute distress.  Skin: Skin is warm and dry. No rash noted.   Cardiovascular: Normal heart rate noted  Respiratory: Normal respiratory effort, no problems with respiration noted  Abdomen: Soft, gravid, appropriate for gestational age. Pain/Pressure: Present     Pelvic: Vag. Bleeding: None     Cervical exam deferred        Extremities: Normal range of motion.  Edema: None  Mental Status: Normal mood and affect. Normal behavior. Normal judgment and thought content.   Urinalysis:      Assessment and Plan:  Pregnancy: G1P0000 at [redacted]w[redacted]d  1. Encounter for supervision of normal first pregnancy in first trimester BP and FHR normal Confirmed cephalic on BSUS  2. Rh negative, antepartum S/p rhogam 12/30/20  3. Small for gestational age (SGA) Resolved EFW 44% on 02/10/21 No further [place] scheduled  4. Primary hypertension On ASA Well controlled with no meds Discuss IOL prior to 40wks  at next visit if not yet delivered  Term labor symptoms and general obstetric precautions including but not limited to vaginal bleeding, contractions, leaking of fluid and fetal movement were reviewed in detail with the patient. Please refer to After Visit Summary for other counseling recommendations.  Return in 1 week (on 02/22/2021) for ob visit.   Venora Maples, MD

## 2021-02-15 NOTE — Progress Notes (Signed)
Patient presents for ROB. Patient has no concerns today. 

## 2021-02-16 ENCOUNTER — Encounter: Payer: Self-pay | Admitting: Women's Health

## 2021-02-16 DIAGNOSIS — B951 Streptococcus, group B, as the cause of diseases classified elsewhere: Secondary | ICD-10-CM | POA: Insufficient documentation

## 2021-02-16 HISTORY — DX: Streptococcus, group b, as the cause of diseases classified elsewhere: B95.1

## 2021-02-22 ENCOUNTER — Other Ambulatory Visit: Payer: Self-pay

## 2021-02-22 ENCOUNTER — Ambulatory Visit (INDEPENDENT_AMBULATORY_CARE_PROVIDER_SITE_OTHER): Payer: Medicaid Other | Admitting: Obstetrics

## 2021-02-22 ENCOUNTER — Encounter: Payer: Self-pay | Admitting: Obstetrics

## 2021-02-22 VITALS — BP 128/86 | HR 93 | Wt 167.1 lb

## 2021-02-22 DIAGNOSIS — Z3480 Encounter for supervision of other normal pregnancy, unspecified trimester: Secondary | ICD-10-CM

## 2021-02-22 NOTE — Progress Notes (Signed)
Pt presents for ROB w/o complaints today.

## 2021-02-22 NOTE — Progress Notes (Signed)
Subjective:  Anita Hoover is a 25 y.o. G1P0000 at [redacted]w[redacted]d being seen today for ongoing prenatal care.  She is currently monitored for the following issues for this low-risk pregnancy and has HTN (hypertension); Acne; GAD (generalized anxiety disorder); Panic disorder; Encounter for supervision of normal first pregnancy in first trimester; Rh negative, antepartum; Small for gestational age (SGA); and Group B streptococcal infection during pregnancy on their problem list.  Patient reports no complaints.  Contractions: Irritability. Vag. Bleeding: None.  Movement: Present. Denies leaking of fluid.   The following portions of the patient's history were reviewed and updated as appropriate: allergies, current medications, past family history, past medical history, past social history, past surgical history and problem list. Problem list updated.  Objective:   Vitals:   02/22/21 1127  BP: 128/86  Pulse: 93  Weight: 167 lb 1.6 oz (75.8 kg)    Fetal Status: Fetal Heart Rate (bpm): 136   Movement: Present     General:  Alert, oriented and cooperative. Patient is in no acute distress.  Skin: Skin is warm and dry. No rash noted.   Cardiovascular: Normal heart rate noted  Respiratory: Normal respiratory effort, no problems with respiration noted  Abdomen: Soft, gravid, appropriate for gestational age. Pain/Pressure: Present     Pelvic:  Cervical exam deferred        Extremities: Normal range of motion.  Edema: None  Mental Status: Normal mood and affect. Normal behavior. Normal judgment and thought content.   Urinalysis:      Ultrasound: [place] MFM OB FOLLOW UP (Accession [id number]) (Order [id number]) Imaging Date: 02/10/2021 Department: [facility] Released By: Guadalupe Maple Authorizing: Barton Dubois, MD    Exam Status  Status  Final [99]   Study Result  Narrative & Impression   ----------------------------------------------------------------------  OBSTETRICS REPORT                       (Signed Final 02/10/2021 04:49 pm) ---------------------------------------------------------------------- Patient Info  ID #:       [id number]                          D.O.B.:  09-19-1995 (24 yrs)  Name:       Anita Hoover                   Visit Date: 02/10/2021 04:23 pm ---------------------------------------------------------------------- Performed By  Attending:        Noralee Space MD        Ref. Address:     [address]                                                             [phone]  [place]                                                             [address]  Performed By:     Truitt Leep,      [facility]  Referred By:      [facility] ---------------------------------------------------------------------- Orders  #  Description                           Code        Ordered By  1  [place] MFM FETAL BPP                      [id number]     YU FANG     W/NONSTRESS  2  [place] MFM OB FOLLOW UP                   [id number]    YU FANG ----------------------------------------------------------------------  #  Order #                     Accession #                Episode #  1  [id number]                   [id number]                 [id number]  2  [id number]                   [id number]                 [id number] ---------------------------------------------------------------------- Indications  [redacted] weeks gestation of pregnancy                Z3A.36  Small for gestational age fetus affecting      O36.5990  management of  mother  Hypertension - Chronic/Pre-existing (No        O10.019  Meds)  Encounter for other antenatal screening        Z36.2  follow-up ---------------------------------------------------------------------- Fetal Evaluation  Num Of Fetuses:  1  Cardiac Activity:       Observed  Presentation:           Cephalic  Placenta:               Posterior  P. Cord Insertion:      Previously Visualized  Amniotic Fluid  AFI FV:      Within normal limits  AFI Sum(cm)     %Tile       Largest Pocket(cm)  15.15           56          5.87  RUQ(cm)       RLQ(cm)       LUQ(cm)        LLQ(cm)  2.25          3.58          5.87           3.45 ---------------------------------------------------------------------- Biophysical Evaluation  Amniotic F.V:   Pocket => 2 cm             F. Tone:        Observed  F. Movement:    Observed                   N.S.T:          Reactive  F. Breathing:   Not Observed               Score:          8/10 ---------------------------------------------------------------------- Biometry  BPD:      87.5  mm     G. Age:  35w 2d         30  %    CI:        75.89   %    70 - 86                                                          FL/HC:      21.7   %    20.1 - 22.1  HC:      318.4  mm     G. Age:  35w 6d         12  %    HC/AC:      0.97        0.93 - 1.11  AC:      326.9  mm     G. Age:  36w 4d         67  %    FL/BPD:     79.0   %    71 - 87  FL:       69.1  mm     G. Age:  35w 3d         23  %    FL/AC:      21.1   %    20 - 24  LV:        3.8  mm  Est. FW:    2847  gm      6 lb 4 oz     44  % ---------------------------------------------------------------------- OB History  Gravidity:    1         Term:   0  Living:  0 ---------------------------------------------------------------------- Gestational Age  U/S Today:     35w 6d                                        EDD:   03/11/21  Best:          36w 3d     Det. By:  Previous Ultrasound      EDD:   03/07/21                                       (08/02/20) ---------------------------------------------------------------------- Anatomy  Cranium:               Appears normal         LVOT:                   Appears normal  Cavum:                 Appears normal         Aortic Arch:            Appears normal  Ventricles:            Appears normal         Ductal Arch:            Appears normal  Face:                  Appears normal         Stomach:                Appears normal, left                         (orbits and profile)                           sided  Lips:                  Appears normal         Kidneys:                Appear normal  Heart:                 Appears normal         Bladder:                Appears normal                         (4CH, axis, and                         situs)  RVOT:                  Appears normal ---------------------------------------------------------------------- Impression  Chronic hypertension.  Well-controlled without  antihypertensives.  Blood pressure today at her office is  130/86 mmHg.  Fetal growth is appropriate for gestational age.Amniotic fluid  is normal and good fetal activity is seen .fetal breathing  movements did not meet the criteria of BPP.  NST is reactive.  BPP 8/10.  We reassured the patient of the findings. ---------------------------------------------------------------------- Recommendations  -No follow-up appointments were made. ----------------------------------------------------------------------  Noralee Space, MD Electronically Signed Final Report   02/10/2021 04:49 pm ----------------------------------------------------------------------    R   Assessment and Plan:  Pregnancy: G1P0000 at [redacted]w[redacted]d  1. Supervision of other normal pregnancy, antepartum - fetus thought to be SGA earlier in the pregnancy but recent MFM evaluation reveals normal interval growth and AFI, and no further follow-up recommended -  patient also mildly hypertensive earlier in the pregnancy, but BP's have been stable - discussed these findings with the patient, and that she would not need early IOL at this point, per MFM   Term labor symptoms and general obstetric precautions including but not limited to vaginal bleeding, contractions, leaking of fluid and fetal movement were reviewed in detail with the patient. Please refer to After Visit Summary for other counseling recommendations.   Return in about 1 week (around 03/01/2021) for ROB.   Brock Bad, MD  02/22/21

## 2021-02-28 ENCOUNTER — Inpatient Hospital Stay (HOSPITAL_COMMUNITY)
Admission: AD | Admit: 2021-02-28 | Discharge: 2021-03-05 | DRG: 807 | Disposition: A | Discharge status: Home / Self Care | Payer: Medicaid Other | Attending: Obstetrics and Gynecology | Admitting: Obstetrics and Gynecology

## 2021-02-28 ENCOUNTER — Other Ambulatory Visit: Payer: Self-pay

## 2021-02-28 ENCOUNTER — Encounter (HOSPITAL_COMMUNITY): Payer: Self-pay | Admitting: Family Medicine

## 2021-02-28 ENCOUNTER — Ambulatory Visit (INDEPENDENT_AMBULATORY_CARE_PROVIDER_SITE_OTHER): Payer: Medicaid Other | Admitting: *Deleted

## 2021-02-28 VITALS — BP 146/103 | HR 97 | Wt 166.7 lb

## 2021-02-28 DIAGNOSIS — Z6791 Unspecified blood type, Rh negative: Secondary | ICD-10-CM | POA: Diagnosis not present

## 2021-02-28 DIAGNOSIS — O114 Pre-existing hypertension with pre-eclampsia, complicating childbirth: Principal | ICD-10-CM | POA: Diagnosis present

## 2021-02-28 DIAGNOSIS — O26893 Other specified pregnancy related conditions, third trimester: Secondary | ICD-10-CM | POA: Diagnosis not present

## 2021-02-28 DIAGNOSIS — O1002 Pre-existing essential hypertension complicating childbirth: Secondary | ICD-10-CM | POA: Diagnosis not present

## 2021-02-28 DIAGNOSIS — Z3A39 39 weeks gestation of pregnancy: Secondary | ICD-10-CM

## 2021-02-28 DIAGNOSIS — O329XX0 Maternal care for malpresentation of fetus, unspecified, not applicable or unspecified: Secondary | ICD-10-CM

## 2021-02-28 DIAGNOSIS — O1414 Severe pre-eclampsia complicating childbirth: Secondary | ICD-10-CM | POA: Diagnosis not present

## 2021-02-28 DIAGNOSIS — Z3689 Encounter for other specified antenatal screening: Secondary | ICD-10-CM

## 2021-02-28 DIAGNOSIS — B951 Streptococcus, group B, as the cause of diseases classified elsewhere: Secondary | ICD-10-CM | POA: Diagnosis present

## 2021-02-28 DIAGNOSIS — Z20822 Contact with and (suspected) exposure to covid-19: Secondary | ICD-10-CM | POA: Diagnosis present

## 2021-02-28 DIAGNOSIS — O1413 Severe pre-eclampsia, third trimester: Secondary | ICD-10-CM

## 2021-02-28 DIAGNOSIS — O99824 Streptococcus B carrier state complicating childbirth: Secondary | ICD-10-CM | POA: Diagnosis present

## 2021-02-28 DIAGNOSIS — Z3A00 Weeks of gestation of pregnancy not specified: Secondary | ICD-10-CM | POA: Diagnosis not present

## 2021-02-28 DIAGNOSIS — O26899 Other specified pregnancy related conditions, unspecified trimester: Secondary | ICD-10-CM

## 2021-02-28 DIAGNOSIS — O119 Pre-existing hypertension with pre-eclampsia, unspecified trimester: Secondary | ICD-10-CM | POA: Diagnosis present

## 2021-02-28 DIAGNOSIS — Z3401 Encounter for supervision of normal first pregnancy, first trimester: Secondary | ICD-10-CM

## 2021-02-28 HISTORY — DX: Pre-existing hypertension with pre-eclampsia, unspecified trimester: O11.9

## 2021-02-28 LAB — COMPREHENSIVE METABOLIC PANEL
ALT: 18 U/L (ref 0–44)
AST: 26 U/L (ref 15–41)
Albumin: 2.8 g/dL — ABNORMAL LOW (ref 3.5–5.0)
Alkaline Phosphatase: 85 U/L (ref 38–126)
Anion gap: 8 (ref 5–15)
BUN: 5 mg/dL — ABNORMAL LOW (ref 6–20)
CO2: 22 mmol/L (ref 22–32)
Calcium: 8.7 mg/dL — ABNORMAL LOW (ref 8.9–10.3)
Chloride: 103 mmol/L (ref 98–111)
Creatinine, Ser: 0.8 mg/dL (ref 0.44–1.00)
GFR, Estimated: >60 mL/min (ref >60)
Glucose, Bld: 70 mg/dL (ref 70–99)
Potassium: 4.1 mmol/L (ref 3.5–5.1)
Sodium: 133 mmol/L — ABNORMAL LOW (ref 135–145)
Total Bilirubin: 0.5 mg/dL (ref 0.3–1.2)
Total Protein: 5.9 g/dL — ABNORMAL LOW (ref 6.5–8.1)

## 2021-02-28 LAB — CBC
HCT: 32.8 % — ABNORMAL LOW (ref 36.0–46.0)
Hemoglobin: 10.9 g/dL — ABNORMAL LOW (ref 12.0–15.0)
MCH: 30 pg (ref 26.0–34.0)
MCHC: 33.2 g/dL (ref 30.0–36.0)
MCV: 90.4 fL (ref 80.0–100.0)
Platelets: 169 10*3/uL (ref 150–400)
RBC: 3.63 MIL/uL — ABNORMAL LOW (ref 3.87–5.11)
RDW: 14.4 % (ref 11.5–15.5)
WBC: 8.4 10*3/uL (ref 4.0–10.5)
nRBC: 0.2 % (ref 0.0–0.2)

## 2021-02-28 LAB — TYPE AND SCREEN
ABO/RH(D): A NEG
Antibody Screen: POSITIVE

## 2021-02-28 LAB — PROTEIN / CREATININE RATIO, URINE
Creatinine, Urine: 69.69 mg/dL
Total Protein, Urine: <6 mg/dL

## 2021-02-28 LAB — RESP PANEL BY RT-PCR (FLU A&B, COVID) ARPGX2
Influenza A by PCR: NEGATIVE
Influenza B by PCR: NEGATIVE
SARS Coronavirus 2 by RT PCR: NEGATIVE

## 2021-02-28 MED ORDER — LACTATED RINGERS IV SOLN: INTRAVENOUS | Status: DC

## 2021-02-28 MED ORDER — LACTATED RINGERS IV SOLN: 500.0000 mL | INTRAVENOUS | Status: DC | PRN

## 2021-02-28 MED ORDER — MISOPROSTOL 25 MCG QUARTER TABLET
25.0000 ug | ORAL_TABLET | ORAL | Status: DC | PRN
  Administered 2021-03-01 (×3): 25 ug via VAGINAL
  Filled 2021-02-28 (×5): qty 1

## 2021-02-28 MED ORDER — PENICILLIN G POT IN DEXTROSE 60000 UNIT/ML IV SOLN
3.0000 10*6.[IU] | INTRAVENOUS | Status: DC
  Administered 2021-03-01 – 2021-03-02 (×11): 3 10*6.[IU] via INTRAVENOUS
  Filled 2021-02-28 (×11): qty 50

## 2021-02-28 MED ORDER — MAGNESIUM SULFATE BOLUS VIA INFUSION
4.0000 g | Freq: Once | INTRAVENOUS | Status: AC
  Administered 2021-02-28: 4 g via INTRAVENOUS
  Filled 2021-02-28: qty 1000

## 2021-02-28 MED ORDER — OXYTOCIN-SODIUM CHLORIDE 30-0.9 UT/500ML-% IV SOLN
2.5000 [IU]/h | INTRAVENOUS | Status: DC
  Administered 2021-03-03: 2.5 [IU]/h via INTRAVENOUS
  Filled 2021-02-28: qty 500

## 2021-02-28 MED ORDER — MAGNESIUM SULFATE 40 GM/1000ML IV SOLN
2.0000 g/h | INTRAVENOUS | Status: DC
  Administered 2021-02-28 – 2021-03-01 (×2): 2 g/h via INTRAVENOUS
  Filled 2021-02-28 (×3): qty 1000

## 2021-02-28 MED ORDER — OXYCODONE-ACETAMINOPHEN 5-325 MG PO TABS
2.0000 | ORAL_TABLET | ORAL | Status: DC | PRN
Start: 2021-02-28 — End: 2021-03-03

## 2021-02-28 MED ORDER — ONDANSETRON HCL 4 MG/2ML IJ SOLN
4.0000 mg | Freq: Four times a day (QID) | INTRAMUSCULAR | Status: DC | PRN
  Administered 2021-03-02: 4 mg via INTRAVENOUS
  Filled 2021-02-28: qty 2

## 2021-02-28 MED ORDER — SODIUM CHLORIDE 0.9 % IV SOLN: 5.0000 10*6.[IU] | Freq: Once | INTRAVENOUS | Status: DC

## 2021-02-28 MED ORDER — LIDOCAINE HCL (PF) 1 % IJ SOLN: 30.0000 mL | INTRAMUSCULAR | Status: DC | PRN

## 2021-02-28 MED ORDER — PENICILLIN G POT IN DEXTROSE 60000 UNIT/ML IV SOLN: 3.0000 10*6.[IU] | INTRAVENOUS | Status: DC

## 2021-02-28 MED ORDER — OXYCODONE-ACETAMINOPHEN 5-325 MG PO TABS: 1.0000 | ORAL_TABLET | ORAL | Status: DC | PRN

## 2021-02-28 MED ORDER — ACETAMINOPHEN 325 MG PO TABS: 650.0000 mg | ORAL_TABLET | ORAL | Status: DC | PRN

## 2021-02-28 MED ORDER — TERBUTALINE SULFATE 1 MG/ML IJ SOLN: 0.2500 mg | Freq: Once | INTRAMUSCULAR | Status: DC | PRN

## 2021-02-28 MED ORDER — SOD CITRATE-CITRIC ACID 500-334 MG/5ML PO SOLN: 30.0000 mL | ORAL | Status: DC | PRN

## 2021-02-28 MED ORDER — OXYTOCIN BOLUS FROM INFUSION
333.0000 mL | Freq: Once | INTRAVENOUS | Status: AC
  Administered 2021-03-03: 333 mL via INTRAVENOUS

## 2021-02-28 MED ORDER — SODIUM CHLORIDE 0.9 % IV SOLN
5.0000 10*6.[IU] | Freq: Once | INTRAVENOUS | Status: AC
  Administered 2021-02-28: 5 10*6.[IU] via INTRAVENOUS
  Filled 2021-02-28: qty 5

## 2021-02-28 NOTE — MAU Note (Signed)
.  Anita Hoover is a 25 y.o. at [redacted]w[redacted]d here in MAU reporting: Sent from [facility] for elevated BP in office. Has a headache unrelieved by 1g of tylenol x1 hour ago. Denies blurry vision or epigastric pain. No VB or LOF. Endorses fetal movement   Pain score: 8 Vitals:   02/28/21 1639  BP: (!) 128/97  Pulse: 86  Resp: 15  Temp: 98.6 F (37 C)     FHT:161

## 2021-02-28 NOTE — Progress Notes (Signed)
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
## 2021-02-28 NOTE — Progress Notes (Addendum)
Subjective:  Anita Hoover is a 25 y.o. female here for BP check.   Hypertension ROS: no TIA's, no chest pain on exertion, no dyspnea on exertion, and noting swelling of ankles.  Reports headache all day today and "lightheaded", nausea and vomiting, some swelling in ankles, some blurry vision earlier today. Denies RUQ pain.   Objective:  Wt 166 lb 11.2 oz (75.6 kg)   LMP 05/24/2020   BMI 27.74 kg/m   Appearance alert, well appearing, and in no distress, oriented to person, place, and time, and normal appearing weight. General exam BP noted to be well controlled today in office.    Assessment:   Blood Pressure poorly controlled.   Plan:  Consult with Dr. Donavan Foil for [redacted] wk GA pt with signs and symptoms of preeclampsia and elevated BP.  Patient to go directly to [facility] for further evaluation.

## 2021-03-01 ENCOUNTER — Encounter: Payer: Medicaid Other | Admitting: Obstetrics

## 2021-03-01 LAB — CBC
HCT: 34.3 % — ABNORMAL LOW (ref 36.0–46.0)
Hemoglobin: 11.2 g/dL — ABNORMAL LOW (ref 12.0–15.0)
MCH: 29.6 pg (ref 26.0–34.0)
MCHC: 32.7 g/dL (ref 30.0–36.0)
MCV: 90.7 fL (ref 80.0–100.0)
Platelets: 167 10*3/uL (ref 150–400)
RBC: 3.78 MIL/uL — ABNORMAL LOW (ref 3.87–5.11)
RDW: 14.6 % (ref 11.5–15.5)
WBC: 10.3 10*3/uL (ref 4.0–10.5)
nRBC: 0 % (ref 0.0–0.2)

## 2021-03-01 LAB — RPR: RPR Ser Ql: NONREACTIVE

## 2021-03-01 MED ORDER — DIPHENHYDRAMINE HCL 50 MG/ML IJ SOLN
12.5000 mg | INTRAMUSCULAR | Status: DC | PRN
Start: 2021-03-01 — End: 2021-03-03

## 2021-03-01 MED ORDER — FENTANYL CITRATE (PF) 100 MCG/2ML IJ SOLN
50.0000 ug | INTRAMUSCULAR | Status: DC | PRN
Start: 2021-03-01 — End: 2021-03-03
  Administered 2021-03-01: 50 ug via INTRAVENOUS
  Administered 2021-03-01 – 2021-03-02 (×2): 100 ug via INTRAVENOUS
  Filled 2021-03-01 (×3): qty 2

## 2021-03-01 MED ORDER — PHENYLEPHRINE 40 MCG/ML (10ML) SYRINGE FOR IV PUSH (FOR BLOOD PRESSURE SUPPORT)
80.0000 ug | PREFILLED_SYRINGE | INTRAVENOUS | Status: DC | PRN
  Filled 2021-03-01: qty 10

## 2021-03-01 MED ORDER — EPHEDRINE 5 MG/ML INJ: 10.0000 mg | INTRAVENOUS | Status: DC | PRN

## 2021-03-01 MED ORDER — OXYTOCIN-SODIUM CHLORIDE 30-0.9 UT/500ML-% IV SOLN
1.0000 m[IU]/min | INTRAVENOUS | Status: DC
  Administered 2021-03-01: 2 m[IU]/min via INTRAVENOUS
  Filled 2021-03-01: qty 500

## 2021-03-01 MED ORDER — PHENYLEPHRINE 40 MCG/ML (10ML) SYRINGE FOR IV PUSH (FOR BLOOD PRESSURE SUPPORT): 80.0000 ug | PREFILLED_SYRINGE | INTRAVENOUS | Status: DC | PRN

## 2021-03-01 MED ORDER — FENTANYL CITRATE (PF) 100 MCG/2ML IJ SOLN
INTRAMUSCULAR | Status: AC
  Administered 2021-03-01: 50 ug via INTRAVENOUS
  Filled 2021-03-01: qty 2

## 2021-03-01 MED ORDER — FENTANYL-BUPIVACAINE-NACL 0.5-0.125-0.9 MG/250ML-% EP SOLN
12.0000 mL/h | EPIDURAL | Status: DC | PRN
  Administered 2021-03-02: 12 mL/h via EPIDURAL
  Filled 2021-03-01 (×2): qty 250

## 2021-03-01 MED ORDER — TERBUTALINE SULFATE 1 MG/ML IJ SOLN: 0.2500 mg | Freq: Once | INTRAMUSCULAR | Status: DC | PRN

## 2021-03-01 MED ORDER — LACTATED RINGERS IV SOLN
500.0000 mL | Freq: Once | INTRAVENOUS | Status: AC
  Administered 2021-03-01: 500 mL via INTRAVENOUS

## 2021-03-01 NOTE — Progress Notes (Signed)
Anita Hoover is a 25 y.o. G1P0000 at [redacted]w[redacted]d by LMP admitted for induction of labor due to [facility] w SIPE w/ SF.  Subjective: Patient resting comfortably. Denies HA, blurry vision, RUQ pain, n/v/d, no edema.  Objective: BP (!) 134/95   Pulse 81   Temp 97.8 F (36.6 C) (Oral)   Resp 14   LMP 05/24/2020   SpO2 100%  I/O last 3 completed shifts: In: 1037.5 [I.V.:999.2; IV Piggyback:38.3] Out: 900 [Urine:900] Total I/O In: 537.5 [P.O.:125; I.V.:312.5; IV Piggyback:100] Out: 800 [Urine:800]  FHT:  FHR: 125 bpm, variability: moderate,  accelerations:  Present,  decelerations:  Absent UC:   irregular, every 12-15 minutes SVE:   Dilation: Fingertip Effacement (%): Thick Station: Ballotable Exam by:: Shazia Mitchener MD  Labs: Lab Results  Component Value Date   WBC 8.4 02/28/2021   HGB 10.9 (L) 02/28/2021   HCT 32.8 (L) 02/28/2021   MCV 90.4 02/28/2021   PLT 169 02/28/2021    Assessment / Plan: Induction of labor due to preeclampsia.  Labor:  Recent check with soft cervix, but unable to reach internal os, finger tip, not ready for FB yet. 3rd dose of cytotec given, placed vaginally. Hopeful for FB placement in 4 hours. Preeclampsia:  on magnesium sulfate, no signs or symptoms of toxicity, intake and ouput balanced, and labs stable. Bps elevated, no severe range. Most recent 130s/90s. Fetal Wellbeing:  Category I Pain Control:   prn I/D:   GBS pos, on PCN Anticipated MOD:  NSVD  Shirlean Mylar 03/01/2021, 10:27 AM

## 2021-03-01 NOTE — Anesthesia Preprocedure Evaluation (Addendum)
Anesthesia Evaluation  Patient identified by MRN, date of birth, ID band Patient awake    Reviewed: Allergy & Precautions, H&P , Patient's Chart, lab work & pertinent test results  Airway Mallampati: I       Dental no notable dental hx.    Pulmonary neg pulmonary ROS,    Pulmonary exam normal        Cardiovascular hypertension, negative cardio ROS Normal cardiovascular exam     Neuro/Psych    GI/Hepatic negative GI ROS, Neg liver ROS,   Endo/Other  negative endocrine ROS  Renal/GU negative Renal ROS  negative genitourinary   Musculoskeletal   Abdominal Normal abdominal exam  (+)   Peds  Hematology  (+) Blood dyscrasia, anemia ,   Anesthesia Other Findings   Reproductive/Obstetrics (+) Pregnancy                             Anesthesia Physical Anesthesia Plan  ASA: 2  Anesthesia Plan: Epidural   Post-op Pain Management:    Induction:   PONV Risk Score and Plan:   Airway Management Planned:   Additional Equipment:   Intra-op Plan:   Post-operative Plan:   Informed Consent: I have reviewed the patients History and Physical, chart, labs and discussed the procedure including the risks, benefits and alternatives for the proposed anesthesia with the patient or authorized representative who has indicated his/her understanding and acceptance.       Plan Discussed with:   Anesthesia Plan Comments:         Anesthesia Quick Evaluation

## 2021-03-01 NOTE — Progress Notes (Addendum)
Anita Hoover is a 25 y.o. G1P0000 at [redacted]w[redacted]d by LMP admitted for induction of labor due to [facility] w SIPE w/ SF.  Subjective: Patient resting comfortably. Denies HA, blurry vision, RUQ pain, n/v/d, no edema.  Objective: BP (!) 130/93   Pulse 81   Temp 97.8 F (36.6 C) (Oral)   Resp 14   LMP 05/24/2020   SpO2 100%  I/O last 3 completed shifts: In: 1037.5 [I.V.:999.2; IV Piggyback:38.3] Out: 900 [Urine:900] Total I/O In: 1386.7 [P.O.:250; I.V.:1025; IV Piggyback:111.7] Out: 1800 [Urine:1800]  FHT:  FHR: 125 bpm, variability: moderate,  accelerations:  Present,  decelerations:  Absent UC:   regular q2-3 min SVE:   Dilation: 1.5 Effacement (%): 50 Station: -3 Exam by:: S Nix RN  Labs: Lab Results  Component Value Date   WBC 8.4 02/28/2021   HGB 10.9 (L) 02/28/2021   HCT 32.8 (L) 02/28/2021   MCV 90.4 02/28/2021   PLT 169 02/28/2021    Assessment / Plan: Induction of labor due to preeclampsia.  Labor: S/p cytotec x3, cervix very soft with 1.5 cm dilation. Foley catheter placed with speculum and ring forceps. Contracting too frequently for cytotec at this time, can consider redose when contractions space out. Preeclampsia:  on magnesium sulfate, no signs or symptoms of toxicity, intake and ouput balanced, and labs stable. Bps elevated, no severe range. Most recent 130s/90s. Fetal Wellbeing:  Category I Pain Control:   prn  IV meds, eventually epidural I/D:   GBS pos, on PCN Anticipated MOD:  NSVD  Shirlean Mylar 03/01/2021, 4:10 PM

## 2021-03-01 NOTE — Progress Notes (Addendum)
Labor Progress Note Takeysha Bonk is a 25 y.o. G1P0000 at [redacted]w[redacted]d presented for induction of labor due to cHTN w SIPE w/ SF. S: Patient is having very painful contractions. Pain medicine is minimally helping. Denies headaches, blurry vision, n/v/d, RUQ pain.   O:  BP (!) 131/94   Pulse (!) 101   Temp 98.3 F (36.8 C) (Oral)   Resp 16   LMP 05/24/2020   SpO2 100%  EFM: baseline 130 BPM/ min-mod variability/+accels/-decels Toco: q3-5 min contractions   CVE: Dilation: 5.5 Effacement (%): 50 Station: -3 Presentation: Vertex Exam by:: Ceasar Mons, MD   A&P: 25 y.o. G1P0000 [redacted]w[redacted]d presented for induction of labor due to cHTN w SIPE w/ SF. #IOL: S/p cytotec x3. FB dislodged. Pt has remained unchanged. Counseled pt on the option of pitocin. She expresses that she would like an epidural first. Will continue with pit 2/2 and epidural. Consider AROM at a later recheck if needed. #Pain: Epidural as desired  #FWB: cat 1>has had min variability most of the day. Strip looks better now.  #GBS positive>PCN #PreE:  On magnesium sulfate, no signs or symptoms of toxicity, intake and ouput balanced, and labs stable. Bps elevated, no severe range. Most recent 130s/90s. Continue to monitor.   Alfredo Martinez, MD [facility] for [employer], [facility] 8:44 PM

## 2021-03-01 NOTE — Progress Notes (Signed)
Labor Progress Note Briellah Baik is a 25 y.o. G1P0000 at [redacted]w[redacted]d presented for IOL- cHTN w/ SIPE and SF S: Patient is resting comfortably with family at bedside. Denies any HA or blurry vision at this time.   O:  BP 130/86   Pulse 86   Temp 98.4 F (36.9 C) (Oral)   Resp 16   LMP 05/24/2020   SpO2 100%  EFM: baseline 130 BPM/mod variability/+accels/-decels  Toco: contractions q17min  CVE: Dilation: Fingertip Effacement (%): Thick Station: Ballotable Presentation: Vertex Exam by:: Ceasar Mons, MD   A&P: 25 y.o. G1P0000 [redacted]w[redacted]d IOL- cHTN w/ SIPE and SF #Labor: Pt remains a fingertip. Will do another dose of cytotec (x2). Possibly place FB on next check.  #Pain: PRN-epidural if desired  #FWB: cat 1 #Superimposed Pre-eclampsia on cHTN with severe features: Currently asymptomatic. Mag running. BPs remain under severe range, last Bps: 130/86, 120/67.  #GBS positive>PCN   Alfredo Martinez, MD [facility] [employer], [facility] 5:43 AM

## 2021-03-02 ENCOUNTER — Encounter (HOSPITAL_COMMUNITY): Payer: Self-pay | Admitting: Family Medicine

## 2021-03-02 ENCOUNTER — Inpatient Hospital Stay (HOSPITAL_COMMUNITY): Payer: Medicaid Other | Admitting: Anesthesiology

## 2021-03-02 DIAGNOSIS — D649 Anemia, unspecified: Secondary | ICD-10-CM | POA: Diagnosis not present

## 2021-03-02 DIAGNOSIS — O9902 Anemia complicating childbirth: Secondary | ICD-10-CM | POA: Diagnosis not present

## 2021-03-02 DIAGNOSIS — Z3A39 39 weeks gestation of pregnancy: Secondary | ICD-10-CM | POA: Diagnosis not present

## 2021-03-02 DIAGNOSIS — O164 Unspecified maternal hypertension, complicating childbirth: Secondary | ICD-10-CM | POA: Diagnosis not present

## 2021-03-02 LAB — CBC
HCT: 33.2 % — ABNORMAL LOW (ref 36.0–46.0)
Hemoglobin: 10.9 g/dL — ABNORMAL LOW (ref 12.0–15.0)
MCH: 29.7 pg (ref 26.0–34.0)
MCHC: 32.8 g/dL (ref 30.0–36.0)
MCV: 90.5 fL (ref 80.0–100.0)
Platelets: 175 10*3/uL (ref 150–400)
RBC: 3.67 MIL/uL — ABNORMAL LOW (ref 3.87–5.11)
RDW: 14.5 % (ref 11.5–15.5)
WBC: 12.3 10*3/uL — ABNORMAL HIGH (ref 4.0–10.5)
nRBC: 0 % (ref 0.0–0.2)

## 2021-03-02 LAB — MAGNESIUM: Magnesium: 7.3 mg/dL (ref 1.7–2.4)

## 2021-03-02 MED ORDER — BUTALBITAL-APAP-CAFFEINE 50-325-40 MG PO TABS: 1.0000 | ORAL_TABLET | Freq: Four times a day (QID) | ORAL | Status: DC | PRN

## 2021-03-02 MED ORDER — ZOLPIDEM TARTRATE 5 MG PO TABS
5.0000 mg | ORAL_TABLET | Freq: Every evening | ORAL | Status: DC | PRN
  Administered 2021-03-02: 5 mg via ORAL
  Filled 2021-03-02: qty 1

## 2021-03-02 MED ORDER — LORAZEPAM 2 MG/ML IJ SOLN
INTRAMUSCULAR | Status: AC
  Filled 2021-03-02: qty 1

## 2021-03-02 MED ORDER — LORAZEPAM 2 MG/ML IJ SOLN: 0.5000 mg | Freq: Once | INTRAMUSCULAR | Status: DC

## 2021-03-02 MED ORDER — LIDOCAINE HCL (PF) 1 % IJ SOLN
INTRAMUSCULAR | Status: DC | PRN
  Administered 2021-03-02: 11 mL via EPIDURAL

## 2021-03-02 MED ORDER — BUTALBITAL-APAP-CAFFEINE 50-325-40 MG PO TABS: 1.0000 | ORAL_TABLET | ORAL | Status: DC | PRN

## 2021-03-02 NOTE — Progress Notes (Signed)
Labor Progress Note Kaysey Berndt is a 25 y.o. G1P0000 at [redacted]w[redacted]d presented for IOL cHTN SI PreE w/ SF S:  Patient is breathing through contractions. She is still not sure she wants an epidural and is curious about other forms of pain management. She is having gas pains and would like something for that  O:  BP (!) 141/93   Pulse 97   Temp 98.3 F (36.8 C) (Oral)   Resp 16   LMP 05/24/2020   SpO2 100%  EFM: 130bpm/moderate variability/+accels, no decels Toco: q4-48min, not tracing well  CVE: Dilation: 5.5 Effacement (%): 60 Station: -2 Presentation: Vertex Exam by:: Tiara Bartoli   A&P: 24 y.o. G1P0000 [redacted]w[redacted]d IOL cHTN SI PreE w/ SF #Labor: S/p Cytotec x3 and Foley Bulb. Started Pitocin 8/3 @ 2239 without significant change. AROM clear fluid @ 0920 #Pain: PRN #FWB: Cat 1 #GBS positive . PCN #PreE:  On magnesium sulfate, no signs or symptoms of toxicity, intake and ouput balanced, and labs stable. Bps elevated, no severe range. Most recent BPs: 120s-140s/90s. Continue to monitor  Nelson Chimes, MD 9:36 AM

## 2021-03-02 NOTE — Progress Notes (Signed)
Labor Progress Note Anita Hoover is a 25 y.o. G1P0000 at [redacted]w[redacted]d presented for  induction of labor due to cHTN w SIPE w/ SF. S: Patient is sleeping comfortably.  O:  BP 121/85   Pulse 92   Temp 98.3 F (36.8 C) (Oral)   Resp 16   LMP 05/24/2020   SpO2 100%  EFM: Not tracing well baseline 130 BPM/+accels/few decels:variables/ min-mod variability Toco: contractions q5-7 min  CVE: Dilation: 5.5 Effacement (%): 60 Station: -2 Presentation: Vertex Exam by:: Ceasar Mons, MD   A&P: 25 y.o. G1P0000 [redacted]w[redacted]d presented for induction of labor due to cHTN w SIPE w/ SF. #Labor: Will let pt continue with the pit and titrating for awhile longer. On recheck, possibly AROM/place IUPC if needed.  #Pain: PRN #FWB: cat 2> continue monitoring  #GBS positive>PRN #PreE:  On magnesium sulfate, no signs or symptoms of toxicity, intake and ouput balanced, and labs stable. Bps elevated, no severe range. Most recent BPs: 120s-130s/90s. Continue to monitor.     Alfredo Martinez, MD [facility] [employer], [facility] 5:27 AM

## 2021-03-02 NOTE — Anesthesia Procedure Notes (Signed)
Epidural Patient location during procedure: OB Start time: 03/02/2021 11:12 AM End time: 03/02/2021 11:32 AM  Staffing Anesthesiologist: Lowella Curb, MD Performed: anesthesiologist   Preanesthetic Checklist Completed: patient identified, IV checked, site marked, risks and benefits discussed, surgical consent, monitors and equipment checked, pre-op evaluation and timeout performed  Epidural Patient position: sitting Prep: ChloraPrep Patient monitoring: heart rate, cardiac monitor, continuous pulse ox and blood pressure Approach: midline Location: L2-L3 Injection technique: LOR saline  Needle:  Needle type: Tuohy  Needle gauge: 17 G Needle length: 9 cm Needle insertion depth: 6 cm Catheter type: closed end flexible Catheter size: 20 Guage Catheter at skin depth: 10 cm Test dose: negative  Assessment Events: blood not aspirated, injection not painful, no injection resistance, no paresthesia and negative IV test  Additional Notes Reason for block:procedure for pain

## 2021-03-02 NOTE — Progress Notes (Signed)
LABOR PROGRESS NOTE  Anita Hoover is a 25 y.o. G1P0000 at [redacted]w[redacted]d  admitted for IOL for cHTN with SIPE with SF.   Subjective: Doing well, feeling pressure intermittently. Recently having shallow late decels on FHT.   Objective: BP 132/87   Pulse (!) 104   Temp 98.6 F (37 C) (Oral)   Resp 16   LMP 05/24/2020   SpO2 100%  or  Vitals:   03/02/21 1531 03/02/21 1601 03/02/21 1631 03/02/21 1701  BP: 107/68 135/84 125/80 132/87  Pulse: (!) 105 97 99 (!) 104  Resp: 16 16    Temp:  98.6 F (37 C)    TempSrc:  Oral    SpO2:         Dilation: 7 Effacement (%): 70 Station: -1 Presentation: Vertex Exam by:: Noeli Lavery FHT: baseline rate 130, minimal varibility, +acel, no current decel Toco: 5-8 minutes   Labs: Lab Results  Component Value Date   WBC 12.3 (H) 03/02/2021   HGB 10.9 (L) 03/02/2021   HCT 33.2 (L) 03/02/2021   MCV 90.5 03/02/2021   PLT 175 03/02/2021    Patient Active Problem List   Diagnosis Date Noted   Pre-eclampsia superimposed on chronic hypertension, antepartum 02/28/2021   Group B streptococcal infection during pregnancy 02/16/2021   Rh negative, antepartum 12/09/2020   Encounter for supervision of normal first pregnancy in first trimester 08/02/2020   GAD (generalized anxiety disorder) 11/11/2019   Panic disorder 11/11/2019   HTN (hypertension) 01/12/2013   Acne 01/12/2013    Assessment / Plan: 25 y.o. G1P0000 at [redacted]w[redacted]d here for IOL for chronic hypertension with SIPE with SF.   Labor: Minimal progression previously, IUPC earlier today with adequate contractions for the past two hours. Called by RN for shallow late decels despite position changes and IVF, discontinued pit at 1416 with recovery and no further decelerations. Some progression also noted with recheck of cervical exam, now about 7 from 5cm. Will restart pit slowly and monitor. She started induction on 8/3 at 0100.  Fetal Wellbeing:  Cat II strip, improved from earlier this afternoon as stated  above, will monitor closely. Minimal variability with Mg infusion.  Pain Control:  Epidural in place  Anticipated MOD:  Hopeful VD   #gHTN SIPE with SF: Currently asymptomatic, just tired. Pre-e labs WNL. No severe range pressures. Cont Mg.    Leticia Penna, D.O. OB Fellow  03/02/2021, 5:06 PM

## 2021-03-02 NOTE — Progress Notes (Signed)
Labor Progress Note Anita Hoover is a 25 y.o. G1P0000 at [redacted]w[redacted]d presented for induction of labor due to cHTN w SIPE w/ SF. S: Patient is having very painful contractions. Pain medicine is minimally helping. Denies blurry vision, n/v/d, RUQ pain. Currently has a HA onset ~30 min ago.  O:  BP (!) 134/94   Pulse 83   Temp 98.3 F (36.8 C) (Oral)   Resp 18   LMP 05/24/2020   SpO2 100%  EFM: baseline 130 BPM/ min-mod variability/+accels/-decels Toco: q5-7 min contractions   CVE: Dilation: 5.5 Effacement (%): 60 Station: -2 Presentation: Vertex Exam by:: Ceasar Mons, MD   A&P: 25 y.o. G1P0000 [redacted]w[redacted]d presented for induction of labor due to cHTN w SIPE w/ SF. #IOL: S/p cytotec x3. FB dislodged. Pt has remained relatively unchanged from the last check.  Will continue with pit 2/2 and titrate as tolerable.  #Pain: Epidural as desired  #FWB: cat 1>has had min-mod variability. No decels, continue to monitor  #GBS positive>PCN. #PreE:  On magnesium sulfate, no signs or symptoms of toxicity, intake and ouput balanced, and labs stable. Bps elevated, no severe range. Pt has HA-ordered Fioricet for the pain. Most recent BP: 120s-130s/90s. Continue to monitor.   Alfredo Martinez, MD [facility] [employer], [facility] 12:44 AM

## 2021-03-02 NOTE — Progress Notes (Signed)
Labor Progress Note Anita Hoover is a 25 y.o. G1P0000 at [redacted]w[redacted]d presented for IOL cHTN SI PreE w/ SF S:  Patient feeling chilled and tired. She says that she feels mildly lightheaded but denies headaches, blurry vision, chest pain, SOB or RUQ pain  O:  BP 126/79   Pulse 92   Temp 98.3 F (36.8 C) (Oral)   Resp 16   LMP 05/24/2020   SpO2 100%  EFM: 125bpm/mainly minimal variability with periods of moderate/+accels, occasional early/late decels Toco: q2-15min, IUPC in place  CVE: Dilation: 5 Effacement (%): 50 Station: 0 Presentation: Vertex Exam by:: lee   A&P: 24 y.o. G1P0000 [redacted]w[redacted]d IOL cHTN SI PreE w/ SF #Labor: S/p Cytotec x3 and Foley Bulb. Started Pitocin 8/3 @ 2239 without significant change. AROM clear fluid @ 0920. IUPC t@ 1445 #Pain: PRN #FWB: Cat 1 #GBS positive . PCN #PreE:  On magnesium sulfate, no signs or symptoms of toxicity, intake and ouput balanced, and labs stable. Bps elevated, no severe range. Most recent BPs: 120s-140s/90s. Continue to monitor  Nelson Chimes, MD 2:38 PM

## 2021-03-02 NOTE — Progress Notes (Signed)
Patient Vitals for the past 4 hrs:  BP Pulse Resp  03/02/21 2131 126/85 86 --  03/02/21 2101 126/82 83 18  03/02/21 2031 115/71 96 --  03/02/21 2001 121/89 (!) 101 17  03/02/21 1931 105/70 92 --  03/02/21 1901 118/80 93 17  03/02/21 1831 114/74 (!) 101 --  03/02/21 1801 113/71 (!) 109 16   Comfortable w/epidural.  Ctx suddenly spaced out around 2030, RN working to increase pitocin q 30 minutes.  FHR 120's, min to avg variability, + scalp stim.  Cx 7/80/-2/moulding.

## 2021-03-03 ENCOUNTER — Encounter (HOSPITAL_COMMUNITY): Payer: Self-pay | Admitting: Family Medicine

## 2021-03-03 DIAGNOSIS — O114 Pre-existing hypertension with pre-eclampsia, complicating childbirth: Secondary | ICD-10-CM | POA: Diagnosis not present

## 2021-03-03 DIAGNOSIS — O99824 Streptococcus B carrier state complicating childbirth: Secondary | ICD-10-CM | POA: Diagnosis not present

## 2021-03-03 DIAGNOSIS — Z3A39 39 weeks gestation of pregnancy: Secondary | ICD-10-CM | POA: Diagnosis not present

## 2021-03-03 MED ORDER — BENZOCAINE-MENTHOL 20-0.5 % EX AERO: 1.0000 "application " | INHALATION_SPRAY | CUTANEOUS | Status: DC | PRN

## 2021-03-03 MED ORDER — METHYLERGONOVINE MALEATE 0.2 MG PO TABS: 0.2000 mg | ORAL_TABLET | ORAL | Status: DC | PRN

## 2021-03-03 MED ORDER — WITCH HAZEL-GLYCERIN EX PADS: 1.0000 "application " | MEDICATED_PAD | CUTANEOUS | Status: DC | PRN

## 2021-03-03 MED ORDER — METHYLERGONOVINE MALEATE 0.2 MG/ML IJ SOLN: 0.2000 mg | INTRAMUSCULAR | Status: DC | PRN

## 2021-03-03 MED ORDER — PRENATAL MULTIVITAMIN CH
1.0000 | ORAL_TABLET | Freq: Every day | ORAL | Status: DC
Start: 2021-03-03 — End: 2021-03-05
  Administered 2021-03-03 – 2021-03-05 (×3): 1 via ORAL
  Filled 2021-03-03 (×3): qty 1

## 2021-03-03 MED ORDER — ONDANSETRON HCL 4 MG/2ML IJ SOLN: 4.0000 mg | INTRAMUSCULAR | Status: DC | PRN

## 2021-03-03 MED ORDER — SIMETHICONE 80 MG PO CHEW
80.0000 mg | CHEWABLE_TABLET | ORAL | Status: DC | PRN
  Administered 2021-03-03 (×2): 80 mg via ORAL
  Filled 2021-03-03 (×2): qty 1

## 2021-03-03 MED ORDER — ACETAMINOPHEN 325 MG PO TABS: 650.0000 mg | ORAL_TABLET | ORAL | Status: DC | PRN

## 2021-03-03 MED ORDER — MAGNESIUM SULFATE 40 GM/1000ML IV SOLN
1.0000 g/h | INTRAVENOUS | Status: AC
  Administered 2021-03-03: 1 g/h via INTRAVENOUS
  Filled 2021-03-03: qty 1000

## 2021-03-03 MED ORDER — FERROUS SULFATE 325 (65 FE) MG PO TABS
325.0000 mg | ORAL_TABLET | ORAL | Status: DC
  Administered 2021-03-03 – 2021-03-05 (×2): 325 mg via ORAL
  Filled 2021-03-03 (×2): qty 1

## 2021-03-03 MED ORDER — MEDROXYPROGESTERONE ACETATE 150 MG/ML IM SUSP: 150.0000 mg | INTRAMUSCULAR | Status: DC | PRN

## 2021-03-03 MED ORDER — COCONUT OIL OIL: 1.0000 "application " | TOPICAL_OIL | Status: DC | PRN

## 2021-03-03 MED ORDER — MEASLES, MUMPS & RUBELLA VAC IJ SOLR
0.5000 mL | Freq: Once | INTRAMUSCULAR | Status: DC
Start: 2021-03-04 — End: 2021-03-05

## 2021-03-03 MED ORDER — DIBUCAINE (PERIANAL) 1 % EX OINT: 1.0000 "application " | TOPICAL_OINTMENT | CUTANEOUS | Status: DC | PRN

## 2021-03-03 MED ORDER — IBUPROFEN 600 MG PO TABS
600.0000 mg | ORAL_TABLET | Freq: Four times a day (QID) | ORAL | Status: DC
Start: 2021-03-03 — End: 2021-03-05
  Administered 2021-03-03 – 2021-03-05 (×10): 600 mg via ORAL
  Filled 2021-03-03 (×10): qty 1

## 2021-03-03 MED ORDER — FLEET ENEMA 7-19 GM/118ML RE ENEM: 1.0000 | ENEMA | Freq: Every day | RECTAL | Status: DC | PRN

## 2021-03-03 MED ORDER — DIPHENHYDRAMINE HCL 25 MG PO CAPS: 25.0000 mg | ORAL_CAPSULE | Freq: Four times a day (QID) | ORAL | Status: DC | PRN

## 2021-03-03 MED ORDER — DOCUSATE SODIUM 100 MG PO CAPS
100.0000 mg | ORAL_CAPSULE | Freq: Two times a day (BID) | ORAL | Status: DC
Start: 2021-03-03 — End: 2021-03-05
  Administered 2021-03-04 – 2021-03-05 (×4): 100 mg via ORAL
  Filled 2021-03-03 (×4): qty 1

## 2021-03-03 MED ORDER — BISACODYL 10 MG RE SUPP: 10.0000 mg | Freq: Every day | RECTAL | Status: DC | PRN

## 2021-03-03 MED ORDER — ONDANSETRON HCL 4 MG PO TABS
4.0000 mg | ORAL_TABLET | ORAL | Status: DC | PRN
Start: 2021-03-03 — End: 2021-03-05

## 2021-03-03 MED ORDER — TETANUS-DIPHTH-ACELL PERTUSSIS 5-2.5-18.5 LF-MCG/0.5 IM SUSY: 0.5000 mL | PREFILLED_SYRINGE | Freq: Once | INTRAMUSCULAR | Status: DC

## 2021-03-03 NOTE — Lactation Note (Addendum)
This note was copied from a baby's chart. Lactation Consultation Note  Patient Name: Anita Hoover CHYIF'O Date: 03/03/2021 Reason for consult: Follow-up assessment;1st time breastfeeding;Term Age:25 years Per mom, she feels breastfeeding is going well, mom was breastfeeding infant in the cradle hold  position when [facility] entered the room. Infant was latched with depth,infant was actively BF STS, and infant was still BF after 10 minutes when LC left the room.  LC did not assist with latch. Mom was given hand pump for prn use. Mom shown how to use hand pump & how to disassemble, clean, & reassemble parts.  LC discussed infant's input and out put with parents. Mom made aware of O/P services, breastfeeding support groups, community resources, and our phone # for post-discharge questions. Mom's plan: 1-Mom will continue to BF infant according to cues, 8 to 12+ times or more, STS. 2-Mom knows to call RN or LC if she has any questions, concerns or need latch assistance.   Maternal Data Does the patient have breastfeeding experience prior to this delivery?: Yes  Feeding Mother's Current Feeding Choice: Breast Milk  LATCH Score Latch: Grasps breast easily, tongue down, lips flanged, rhythmical sucking.  Audible Swallowing: Spontaneous and intermittent  Type of Nipple: Everted at rest and after stimulation  Comfort (Breast/Nipple): Soft / non-tender  Hold (Positioning): No assistance needed to correctly position infant at breast.  LATCH Score: 10   Lactation Tools Discussed/Used Tools: Pump Breast pump type: Manual Pump Education: Setup, frequency, and cleaning;Milk Storage Reason for Pumping: prn home use Pumping frequency: prn  Interventions Interventions: Position options;Skin to skin;Breast feeding basics reviewed;Hand express;Hand pump;Education  Discharge Pump: Manual (Mom would like hand pump for home use, occassional pumping less than 4 hour seperation from  infant.) WIC Program: Yes  Consult Status Consult Status: Follow-up Date: 03/04/21 Follow-up type: In-patient    Danelle Earthly 03/03/2021, 4:55 PM

## 2021-03-03 NOTE — Lactation Note (Signed)
This note was copied from a baby's chart. Lactation Consultation Note Attempted to see mom. Mom was getting up to the RM w/RN. Mom stated the baby BF well earlier and RN assisted in latch.  Asked mom to call for a feeding today. Lactation brochure given.  Patient Name: Anita Hoover TZGYF'V Date: 03/03/2021 Reason for consult: Initial assessment;Primapara;Term Age:23 hours  Maternal Data    Feeding    LATCH Score                    Lactation Tools Discussed/Used    Interventions Interventions: Breast feeding basics reviewed;Assisted with latch;Support pillows  Discharge    Consult Status Consult Status: Follow-up Date: 03/03/21 Follow-up type: In-patient    [name], Diamond Nickel 03/03/2021, 7:00 AM

## 2021-03-03 NOTE — Discharge Summary (Signed)
Postpartum Discharge Summary      Patient Name: Anita Hoover DOB: [birth year] MRN: [id number]  Date of admission: 02/28/2021 Delivery date:03/03/2021  Delivering provider: Christin Fudge  Date of discharge: 03/05/2021  Admitting diagnosis: Pre-eclampsia superimposed on chronic hypertension, antepartum [O11.9] Intrauterine pregnancy: [redacted]w[redacted]d    Secondary diagnosis:  Active Problems:   Rh negative, antepartum   Group B streptococcal infection during pregnancy   Pre-eclampsia superimposed on chronic hypertension, antepartum  Additional problems: None   Discharge diagnosis: Term Pregnancy Delivered                                              Post partum procedures: Magnesium sulfate for seizure prophylaxis Augmentation: AROM, Pitocin, Cytotec, and IP Foley Complications: None  Hospital course: Induction of Labor With Vaginal Delivery   25y.o. yo G1P1001 at 353w3das admitted to the hospital 02/28/2021 for induction of labor.  Indication for induction: Severe Preeclampsia superimposed on CHTN.  Patient had an uncomplicated labor course as follows: Membrane Rupture Time/Date: 9:17 AM ,03/02/2021   Delivery Method:Vaginal, Spontaneous  Episiotomy: None  Lacerations:  None  Details of delivery can be found in separate delivery note.  Patient received intrapartum and postpartum magnesium sulfate for eclampsia prophylaxis as per protocol. BP control was obtained with Procardia XL, there were no further immediate complications.  Otherwise, patient had a routine postpartum course. Patient is discharged home in stable condition on 03/05/2021, and will follow up in the office for BP check in one week.   Newborn Data: Birth date:03/03/2021  Birth time:12:17 AM  Gender:Female  Living status:Living  Apgars:6 ,7  Weight:3030 g   Magnesium Sulfate received: Yes: Seizure prophylaxis BMZ received: No Rhophylac:No MMR:N/A T-DaP:Given prenatally Flu: Yes Transfusion:No  Physical exam   Vitals:   03/04/21 1252 03/04/21 1540 03/04/21 2000 03/05/21 0355  BP: 132/89 138/78 (!) 145/75 133/81  Pulse: 70 63 (!) 53 70  Resp: 18 18 18 18   Temp: 98.2 F (36.8 C) 98.2 F (36.8 C) 99 F (37.2 C) 98.3 F (36.8 C)  TempSrc: Oral Oral Oral Oral  SpO2: 97% 100% 100% 100%  Weight:      Height:       General: alert, cooperative, and no distress Lochia: appropriate Uterine Fundus: firm Incision: N/A DVT Evaluation: No evidence of DVT seen on physical exam. Negative Homan's sign. No cords or calf tenderness. Labs: Lab Results  Component Value Date   WBC 12.3 (H) 03/02/2021   HGB 10.9 (L) 03/02/2021   HCT 33.2 (L) 03/02/2021   MCV 90.5 03/02/2021   PLT 175 03/02/2021   CMP Latest Ref Rng & Units 02/28/2021  Glucose 70 - 99 mg/dL 70  BUN 6 - 20 mg/dL 5(L)  Creatinine 0.44 - 1.00 mg/dL 0.80  Sodium 135 - 145 mmol/L 133(L)  Potassium 3.5 - 5.1 mmol/L 4.1  Chloride 98 - 111 mmol/L 103  CO2 22 - 32 mmol/L 22  Calcium 8.9 - 10.3 mg/dL 8.7(L)  Total Protein 6.5 - 8.1 g/dL 5.9(L)  Total Bilirubin 0.3 - 1.2 mg/dL 0.5  Alkaline Phos 38 - 126 U/L 85  AST 15 - 41 U/L 26  ALT 0 - 44 U/L 18   Edinburgh Score: Edinburgh Postnatal Depression Scale Screening Tool 03/04/2021  I have been able to laugh and see the funny side of things. 0  I have looked forward with  enjoyment to things. 0  I have blamed myself unnecessarily when things went wrong. 0  I have been anxious or worried for no good reason. 0  I have felt scared or panicky for no good reason. 0  Things have been getting on top of me. 1  I have been so unhappy that I have had difficulty sleeping. 0  I have felt sad or miserable. 0  I have been so unhappy that I have been crying. 0  The thought of harming myself has occurred to me. 0  Edinburgh Postnatal Depression Scale Total 1     After visit meds:  Allergies as of 03/05/2021   No Known Allergies      Medication List     STOP taking these medications     aspirin 81 MG EC tablet   [facility] Prenatal Gummy 0.4 MG Chew   hydrOXYzine 25 MG capsule Commonly known as: Vistaril   Mag-Oxide 200 MG Tabs Generic drug: Magnesium Oxide       TAKE these medications    docusate sodium 100 MG capsule Commonly known as: COLACE Take 1 capsule (100 mg total) by mouth 2 (two) times daily as needed for mild constipation.   ferrous sulfate 325 (65 FE) MG tablet Take 1 tablet (325 mg total) by mouth every other day.   ibuprofen 600 MG tablet Commonly known as: ADVIL Take 1 tablet (600 mg total) by mouth every 6 (six) hours.   NIFEdipine 30 MG 24 hr tablet Commonly known as: ADALAT CC Take 1 tablet (30 mg total) by mouth daily.         Discharge home in stable condition Infant Feeding: Breast Infant Disposition:home with mother Discharge instruction: per After Visit Summary and Postpartum booklet. Activity: Advance as tolerated. Pelvic rest for 6 weeks.  Diet: routine diet Future Appointments: Future Appointments  Date Time Provider [place]  03/09/2021  1:40 PM [place] None  04/06/2021  2:10 PM Rasch, Artist Pais, NP [place] None   Follow up Visit:   Please schedule this patient for a In person postpartum visit in 4 weeks with the following provider: Any provider. Additional Postpartum F/U:BP check 1 week  High risk pregnancy complicated by: HTN Delivery mode:  Vaginal, Spontaneous  Anticipated Birth Control:  Unsure   03/05/2021 Verita Schneiders, MD

## 2021-03-03 NOTE — Lactation Note (Signed)
This note was copied from a baby's chart. Lactation Consultation Note  Patient Name: Anita Hoover ZJIRC'V Date: 03/03/2021 Reason for consult: L&D Initial assessment;Primapara;Term Age:23 hours Baby alert looking around. No cues noted. Baby looks sleepy. Mom on Mag.   Mom has great everted nipples. Baby latched right on w/o difficulty. Mom denied painful latch. Hand expression demonstrated colostrum.  Newborn feeding habits briefly reviewed. Will f/u mom on OBSC.  Maternal Data    Feeding    LATCH Score Latch: Grasps breast easily, tongue down, lips flanged, rhythmical sucking.  Audible Swallowing: A few with stimulation  Type of Nipple: Everted at rest and after stimulation  Comfort (Breast/Nipple): Soft / non-tender  Hold (Positioning): Assistance needed to correctly position infant at breast and maintain latch.  LATCH Score: 8   Lactation Tools Discussed/Used    Interventions Interventions: Breast feeding basics reviewed;Adjust position;Assisted with latch;Support pillows;Skin to skin;Position options;Breast massage;Hand express;Breast compression  Discharge    Consult Status Consult Status: Follow-up Date: 03/03/21 Follow-up type: In-patient    Anita Hoover, Diamond Nickel 03/03/2021, 1:21 AM

## 2021-03-03 NOTE — Anesthesia Postprocedure Evaluation (Signed)
Anesthesia Post Note  Patient: Anita Hoover  Procedure(s) Performed: AN AD HOC LABOR EPIDURAL     Patient location during evaluation: [facility] Anesthesia Type: Epidural Level of consciousness: awake and alert, oriented and patient cooperative Pain management: pain level controlled Vital Signs Assessment: post-procedure vital signs reviewed and stable Respiratory status: spontaneous breathing Cardiovascular status: stable Postop Assessment: no headache, epidural receding, patient able to bend at knees and no signs of nausea or vomiting Anesthetic complications: no Comments: Pt. States she is waking.  Pain score 1.    No notable events documented.  Last Vitals:  Vitals:   03/03/21 0910 03/03/21 1004  BP:    Pulse:    Resp: 17 17  Temp:    SpO2:      Last Pain:  Vitals:   03/03/21 1029  TempSrc:   PainSc: 5    Pain Goal: Patients Stated Pain Goal:  (1-2) (03/03/21 1029)                 Merrilyn Puma

## 2021-03-03 NOTE — Progress Notes (Signed)
POSTPARTUM PROGRESS NOTE  PPD #0  Subjective:  Anita Hoover is a 25 y.o. G1P1001 s/p NSVD at [redacted]w[redacted]d. Today she notes no acute complaints. She denies any problems with ambulating, voiding or po intake. Denies nausea or vomiting. She has not yet passed flatus, noBM.  Pain is well controlled.  Lochia moderate Denies fever/chills/chest pain/SOB.  no HA, no blurry vision, noRUQ pain  Objective: Blood pressure 118/77, pulse 85, temperature 98.1 F (36.7 C), temperature source Oral, resp. rate 18, last menstrual period 05/24/2020, SpO2 95 %, unknown if currently breastfeeding.  Physical Exam:  General: alert, cooperative and no distress Chest: no respiratory distress, CTAB Heart: regular rate and rhythm Abdomen: soft, nontender Uterine Fundus: firm, appropriately tender Incision: n/a DVT Evaluation: No calf swelling or tenderness Extremities: no edema Skin: warm, dry  Results for orders placed or performed during the hospital encounter of 02/28/21 (from the past 24 hour(s))  CBC     Status: Abnormal   Collection Time: 03/02/21  8:00 AM  Result Value Ref Range   WBC 12.3 (H) 4.0 - 10.5 K/uL   RBC 3.67 (L) 3.87 - 5.11 MIL/uL   Hemoglobin 10.9 (L) 12.0 - 15.0 g/dL   HCT 59.4 (L) 58.5 - 92.9 %   MCV 90.5 80.0 - 100.0 fL   MCH 29.7 26.0 - 34.0 pg   MCHC 32.8 30.0 - 36.0 g/dL   RDW 24.4 62.8 - 63.8 %   Platelets 175 150 - 400 K/uL   nRBC 0.0 0.0 - 0.2 %  Magnesium     Status: Abnormal   Collection Time: 03/02/21  9:27 PM  Result Value Ref Range   Magnesium 7.3 (HH) 1.7 - 2.4 mg/dL    Assessment/Plan: Anita Hoover is a 25 y.o. G1P1001 s/p NSVD at [redacted]w[redacted]d PPD#0 complicated by: 1) cHTN with superimposed preeclampsia -on Mag x 24hr -no BP medication currently, will closely monitor  2) Postpartum care -pain well controlled -encourage ambulation as tolerated  Contraception: not sure Feeding: breast feeding  Dispo: Continue postpartum care as outlined above   LOS: 3 days    Anita Hidalgo, DO Faculty Attending, [facility] 03/03/2021, 7:18 AM

## 2021-03-04 NOTE — Plan of Care (Signed)
  Problem: Education: Goal: Knowledge of General Education information will improve Description: Including pain rating scale, medication(s)/side effects and non-pharmacologic comfort measures Outcome: Progressing   Problem: Clinical Measurements: Goal: Ability to maintain clinical measurements within normal limits will improve Outcome: Progressing Goal: Will remain free from infection Outcome: Progressing Goal: Diagnostic test results will improve Outcome: Progressing Goal: Respiratory complications will improve Outcome: Progressing   Problem: Activity: Goal: Risk for activity intolerance will decrease Outcome: Completed/Met   Problem: Nutrition: Goal: Adequate nutrition will be maintained Outcome: Adequate for Discharge   Problem: Coping: Goal: Level of anxiety will decrease Outcome: Adequate for Discharge   Problem: Elimination: Goal: Will not experience complications related to bowel motility Outcome: Progressing Goal: Will not experience complications related to urinary retention Outcome: Adequate for Discharge   Problem: Pain Managment: Goal: General experience of comfort will improve Outcome: Adequate for Discharge   Problem: Skin Integrity: Goal: Risk for impaired skin integrity will decrease Outcome: Completed/Met   Problem: Education: Goal: Knowledge of condition will improve Outcome: Progressing   Problem: Activity: Goal: Will verbalize the importance of balancing activity with adequate rest periods Outcome: Adequate for Discharge Goal: Ability to tolerate increased activity will improve Outcome: Adequate for Discharge   Problem: Role Relationship: Goal: Ability to demonstrate positive interaction with newborn will improve Outcome: Adequate for Discharge
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
## 2021-03-04 NOTE — Lactation Note (Signed)
This note was copied from a baby's chart. Lactation Consultation Note  Patient Name: Anita Hoover NFAOZ'[id number] Date: 03/04/2021 Reason for consult: Follow-up assessment;Primapara;1st time breastfeeding;Term Age:23 hours   P1 mother whose infant is now 68 hours old.  This is a term baby at 39+3 weeks.  Mother reported breast feeding has been going well; no pain or tenderness with latching and feeding.  Baby last fed approximately one hour ago and is swaddled and asleep at this time. Baby is showing feeding cues when ready and voiding/stooling well.  Last LATCH score was a 10.   Mother will continue to feed 8-12 times/24 hours or sooner if baby shows cues.  Encouraged to call for latch assistance as needed.  Mother does not have a DEBP for home use and is not currently interested in obtaining a pump.  Presented her options since she has both private insurance and WIC.  Mother will probably call her insurance company to determine pump eligibility.  Support person present.   Maternal Data Has patient been taught Hand Expression?: Yes Does the patient have breastfeeding experience prior to this delivery?: No  Feeding Mother's Current Feeding Choice: Breast Milk  LATCH Score                    Lactation Tools Discussed/Used Breast pump type: Manual Reason for Pumping: PRN Pumping frequency: PRN  Interventions Interventions: Education  Discharge Pump: Manual;Personal (Mother not interested in obtaining a DEBP at this time) [facility] Program: Yes  Consult Status Consult Status: Follow-up Date: 03/05/21 Follow-up type: In-patient    Dora Sims 03/04/2021, 3:48 PM

## 2021-03-04 NOTE — Progress Notes (Signed)
POSTPARTUM PROGRESS NOTE  Post Partum Day 1  Subjective:  Anita Hoover is a 25 y.o. G1P1001 s/p SVD at [redacted]w[redacted]d.  She reports she is doing well. No acute events overnight. She denies any problems with ambulating, voiding or po intake. Denies nausea or vomiting.  Pain is well controlled.  Lochia is described as moderate to slightly heavy.  Pt has completed 24 hour magnesium recovery.  She denies headache, RUQ pain or visual changes  Objective: Blood pressure 122/73, pulse 63, temperature 98.5 F (36.9 C), temperature source Tympanic, resp. rate 18, height 5\' 5"  (1.651 m), weight 75.6 kg, last menstrual period 05/24/2020, SpO2 98 %, unknown if currently breastfeeding.  Physical Exam:  General: alert, cooperative and no distress Chest: no respiratory distress Heart:regular rate, distal pulses intact Abdomen: soft, nontender,  Uterine Fundus: firm, appropriately tender DVT Evaluation: No calf swelling or tenderness Extremities: minimal edema Skin: warm, dry  Recent Labs    03/01/21 1744 03/02/21 0800  HGB 11.2* 10.9*  HCT 34.3* 33.2*    Assessment/Plan: Anita Hoover is a 25 y.o. G1P1001 s/p SVD at [redacted]w[redacted]d   PPD#1 - Doing well  Routine postpartum care Blood pressures currently normal without medication Monitor BP today Anticipate PM discharge or morning of */7   LOS: 4 days   11-05-2002, MD Faculty attending 03/04/2021, 7:04 AM

## 2021-03-05 MED ORDER — DOCUSATE SODIUM 100 MG PO CAPS: 100.0000 mg | ORAL_CAPSULE | Freq: Two times a day (BID) | ORAL | 0 refills | Status: DC | PRN

## 2021-03-05 MED ORDER — NIFEDIPINE ER 30 MG PO TB24: 30.0000 mg | ORAL_TABLET | Freq: Every day | ORAL | 0 refills | Status: DC

## 2021-03-05 MED ORDER — IBUPROFEN 600 MG PO TABS: 600.0000 mg | ORAL_TABLET | Freq: Four times a day (QID) | ORAL | 0 refills | Status: DC

## 2021-03-05 MED ORDER — FERROUS SULFATE 325 (65 FE) MG PO TABS: 325.0000 mg | ORAL_TABLET | ORAL | 3 refills | Status: DC

## 2021-03-05 MED ORDER — NIFEDIPINE ER OSMOTIC RELEASE 30 MG PO TB24
30.0000 mg | ORAL_TABLET | Freq: Every day | ORAL | Status: DC
  Administered 2021-03-05: 30 mg via ORAL
  Filled 2021-03-05: qty 1

## 2021-03-05 NOTE — Progress Notes (Signed)
Patient discharged to home with significant other.  Condition stable.  Pt ambulated to car with N. Nay, NT.  Discharge instructions reviewed with patient and her mother together.  No equipment for home ordered at discharge.

## 2021-03-05 NOTE — Lactation Note (Signed)
This note was copied from a baby's chart. Lactation Consultation Note  Patient Name: Anita Hoover [id number]'F Date: 03/05/2021 Reason for consult: Follow-up assessment Age:23 hours  P1,  Maternal grandmother who is doula offering mother support in room. Baby latched with ease.  Noted intermittent swallows. Feed on demand with cues.  Goal 8-12+ times per day after first 24 hrs.   Reviewed engorgement care and monitoring voids/stools.   Feeding Mother's Current Feeding Choice: Breast Milk  LATCH Score Latch: Grasps breast easily, tongue down, lips flanged, rhythmical sucking.  Audible Swallowing: Spontaneous and intermittent  Type of Nipple: Everted at rest and after stimulation  Comfort (Breast/Nipple): Soft / non-tender  Hold (Positioning): Assistance needed to correctly position infant at breast and maintain latch.  LATCH Score: 9   Interventions Interventions: Support pillows;Education  Discharge Discharge Education: Engorgement and breast care  Consult Status Consult Status: Complete Date: 03/05/21    Dahlia Byes Boschen RN IBCLC 03/05/2021, 11:02 AM

## 2021-03-05 NOTE — Progress Notes (Signed)
Order for Babyscripts noted by RN.  Patient was enrolled in program during her pregnancy and has equipment to monitor her blood pressure at home.  Patient entered today's morning blood pressure (taken by unit NT) into the app and RN confirmed that the data crossed into her data flowsheet in [facility].  Patient plans to continue to measure and enter BP daily.

## 2021-03-06 ENCOUNTER — Telehealth: Payer: Self-pay

## 2021-03-06 NOTE — Telephone Encounter (Signed)
Transition Care Management Follow-up Telephone Call Date of discharge and from where: 03/05/2021-[facility] How have you been since you were released from the hospital? Patient stated she is doing great.  Any questions or concerns? No  Items Reviewed: Did the pt receive and understand the discharge instructions provided? Yes  Medications obtained and verified? Yes  Other? No  Any new allergies since your discharge? No  Dietary orders reviewed? N/A Do you have support at home? Yes   Home Care and Equipment/Supplies: Were home health services ordered? not applicable If so, what is the name of the agency? N/A  Has the agency set up a time to come to the patient's home? not applicable Were any new equipment or medical supplies ordered?  No What is the name of the medical supply agency? N/A Were you able to get the supplies/equipment? not applicable Do you have any questions related to the use of the equipment or supplies? No  Functional Questionnaire: (I = Independent and D = Dependent) ADLs: I  Bathing/Dressing- I  Meal Prep- I  Eating- I  Maintaining continence- I  Transferring/Ambulation- I  Managing Meds- I  Follow up appointments reviewed:  PCP Hospital f/u appt confirmed? No   Specialist Hospital f/u appt confirmed? Yes  Scheduled to see Venia Carbon, NP on 04/06/2021 @ 2:10 PM. Are transportation arrangements needed? No  If their condition worsens, is the pt aware to call PCP or go to the Emergency Dept.? Yes Was the patient provided with contact information for the PCP's office or ED? Yes Was to pt encouraged to call back with questions or concerns? Yes

## 2021-03-09 ENCOUNTER — Ambulatory Visit: Payer: Medicaid Other

## 2021-03-09 VITALS — BP 140/91 | HR 71

## 2021-03-09 DIAGNOSIS — Z0130 Encounter for examination of blood pressure without abnormal findings: Secondary | ICD-10-CM

## 2021-03-09 NOTE — Progress Notes (Signed)
I connected with  Anita Hoover on 03/09/21 at 8:30 am by telephone and verified that I am speaking with the correct person using two identifiers.   I discussed the limitations, risks, security and privacy concerns of performing an evaluation and management service by telephone and the availability of in person appointments. I also discussed with the patient that there may be a patient responsible charge related to this service. The patient expressed understanding and agreed to proceed.  Anita Hoover, CMA 03/09/2021  8:29 AM   Subjective:  Anita Hoover is a 25 y.o. female here for BP check.   Hypertension ROS: taking medications as instructed, no medication side effects noted, no TIA's, no chest pain on exertion, no dyspnea on exertion, and no swelling of ankles.    Objective:  BP (!) 140/91   Pulse 71   LMP 05/24/2020   Appearance alert, well appearing, and in no distress. General exam BP noted to be well controlled today in office.    Assessment:   Blood Pressure stable. Patient did not take medication this morning. I have advised her to take her medication and I will call her back in 2-3 hours for a recheck of her blood pressure.  Plan: Repeat pressure at 11:30 am. Patient blood pressure is still elevated at 141/90. I have advised her that I will send her b/p reading to the provider to discuss and call her back this afternoon or tomorrow. Patient also requested something for yeast.

## 2021-03-10 ENCOUNTER — Other Ambulatory Visit: Payer: Self-pay

## 2021-03-10 MED ORDER — FLUCONAZOLE 150 MG PO TABS: 150.0000 mg | ORAL_TABLET | Freq: Once | ORAL | 0 refills | Status: AC

## 2021-03-10 MED ORDER — NIFEDIPINE ER 30 MG PO TB24: 60.0000 mg | ORAL_TABLET | Freq: Every day | ORAL | 1 refills | Status: DC

## 2021-03-16 ENCOUNTER — Telehealth (HOSPITAL_COMMUNITY): Payer: Self-pay | Admitting: *Deleted

## 2021-03-16 NOTE — Telephone Encounter (Signed)
Mom reports doing well. No concerns about herself. EPDS = 2 (hospital score = 0) Mom reports baby is well. Feeding, peeing, and pooping without difficulty. Sleeps in crib on her back. No concerns about baby.  Duffy Rhody, RN 03-16-2021 at 2:23pm

## 2021-04-06 ENCOUNTER — Encounter: Payer: Self-pay | Admitting: Obstetrics and Gynecology

## 2021-04-06 ENCOUNTER — Ambulatory Visit (INDEPENDENT_AMBULATORY_CARE_PROVIDER_SITE_OTHER): Payer: Medicaid Other | Admitting: Obstetrics and Gynecology

## 2021-04-06 ENCOUNTER — Other Ambulatory Visit: Payer: Self-pay

## 2021-04-06 DIAGNOSIS — Z30011 Encounter for initial prescription of contraceptive pills: Secondary | ICD-10-CM | POA: Diagnosis not present

## 2021-04-06 MED ORDER — SLYND 4 MG PO TABS: 1.0000 | ORAL_TABLET | Freq: Every day | ORAL | 11 refills | Status: DC

## 2021-04-06 NOTE — Progress Notes (Signed)
Pt in office for PP visit, interested in [facility] pills. Provided label and advised pt to leave urine sample, pt agreed.

## 2021-04-06 NOTE — Progress Notes (Signed)
..   Post Partum Visit Note  Anita Hoover is a 25 y.o. G34P1001 female who presents for a postpartum visit. She is 5 weeks postpartum following a normal spontaneous vaginal delivery.  I have fully reviewed the prenatal and intrapartum course. The delivery was at 39.3 gestational weeks.  Anesthesia: epidural. Postpartum course has been good. Baby is doing well. Baby is feeding by breast. Bleeding no bleeding. Bowel function is normal. Bladder function is normal. Patient is not sexually active. Contraception method is  considering pills . Postpartum depression screening: negative.   The pregnancy intention screening data noted above was reviewed. Potential methods of contraception were discussed. The patient elected to proceed with No data recorded.   Edinburgh Postnatal Depression Scale - 04/06/21 1448       Edinburgh Postnatal Depression Scale:  In the Past 7 Days   I have been able to laugh and see the funny side of things. 0    I have looked forward with enjoyment to things. 0    I have blamed myself unnecessarily when things went wrong. 0    I have been anxious or worried for no good reason. 0    I have felt scared or panicky for no good reason. 0    Things have been getting on top of me. 0    I have been so unhappy that I have had difficulty sleeping. 0    I have felt sad or miserable. 0    I have been so unhappy that I have been crying. 0    The thought of harming myself has occurred to me. 0    Edinburgh Postnatal Depression Scale Total 0             Health Maintenance Due  Topic Date Due   HPV VACCINES (1 - 2-dose series) Never done   INFLUENZA VACCINE  02/27/2021    The following portions of the patient's history were reviewed and updated as appropriate: allergies, current medications, past family history, past medical history, past social history, past surgical history, and problem list.  Review of Systems Pertinent items are noted in HPI.  Objective:  BP 118/81    Pulse 79   Wt 143 lb 3.2 oz (65 kg)   Breastfeeding Yes   BMI 23.83 kg/m    General:  alert and cooperative  Lungs: clear to auscultation bilaterally  Heart:  regular rate and rhythm, S1, S2 normal, no murmur, click, rub or gallop  Abdomen: soft, non-tender; bowel sounds normal; no masses,  no organomegaly        Assessment:   1. Postpartum exam   2. Encounter for initial prescription of contraceptive pills  Rx: Slynd  Normal postpartum exam.   Plan:   Essential components of care per ACOG recommendations:  1.  Mood and well being: Patient with negative depression screening today. Reviewed local resources for support.  - Patient tobacco use? No.   - hx of drug use? No.    2. Infant care and feeding:  -Patient currently breastmilk feeding? Yes. Discussed returning to work and pumping.  -Social determinants of health (SDOH) reviewed in EPIC. No concerns   3. Sexuality, contraception and birth spacing - Patient does not want a pregnancy in the next year.  Desired family size is unsure children.  - Reviewed forms of contraception in tiered fashion. Patient desired oral progesterone-only contraceptive today.   - Discussed birth spacing of 18 months  4. Sleep and fatigue -Encouraged family/partner/community support of  4 hrs of uninterrupted sleep to help with mood and fatigue  5. Physical Recovery  - Discussed patients delivery and complications. She describes her labor as good. - Patient had a Vaginal, no problems at delivery. Patient had no laceration. Perineal healing reviewed. Patient expressed understanding - Patient has urinary incontinence? No. - Patient is safe to resume physical and sexual activity  6.  Health Maintenance - HM due items addressed Yes - Last pap smear  Diagnosis  Date Value Ref Range Status  08/11/2020   Final   - Negative for intraepithelial lesion or malignancy (NILM)   Pap smear not done at today's visit.  -Breast Cancer screening  indicated? No.   7. Chronic Disease/Pregnancy Condition follow up: None  - PCP follow up  Venia Carbon, NP [facility] [employer], [facility]

## 2021-04-18 ENCOUNTER — Telehealth: Payer: Self-pay

## 2021-04-18 DIAGNOSIS — B3731 Acute candidiasis of vulva and vagina: Secondary | ICD-10-CM

## 2021-04-18 DIAGNOSIS — B373 Candidiasis of vulva and vagina: Secondary | ICD-10-CM

## 2021-04-18 MED ORDER — FLUCONAZOLE 150 MG PO TABS: 150.0000 mg | ORAL_TABLET | Freq: Once | ORAL | 0 refills | Status: AC

## 2021-04-18 NOTE — Telephone Encounter (Signed)
Pt called requesting Rx for yeast infection. C/O vaginal d/c with itching. Will send in Diflucan 150mg  1 po with no refills. Pt aware. Rx to pharmacy.

## 2021-05-10 ENCOUNTER — Ambulatory Visit: Payer: Medicaid Other | Admitting: Obstetrics and Gynecology

## 2021-05-29 ENCOUNTER — Telehealth: Payer: Self-pay

## 2021-05-29 NOTE — Telephone Encounter (Signed)
Breast pump order signed and faxed back to [facility].  Confirmation received.

## 2021-09-27 DIAGNOSIS — D1721 Benign lipomatous neoplasm of skin and subcutaneous tissue of right arm: Secondary | ICD-10-CM | POA: Diagnosis not present

## 2021-09-27 IMAGING — US US MFM OB FOLLOW-UP
1 series · 13 of 28 positions shown · non-contrast
Comparison: none

[Series 1: us mfm ob follow-up · 75 acquisitions, 13 frames shown]
[im 3/75]
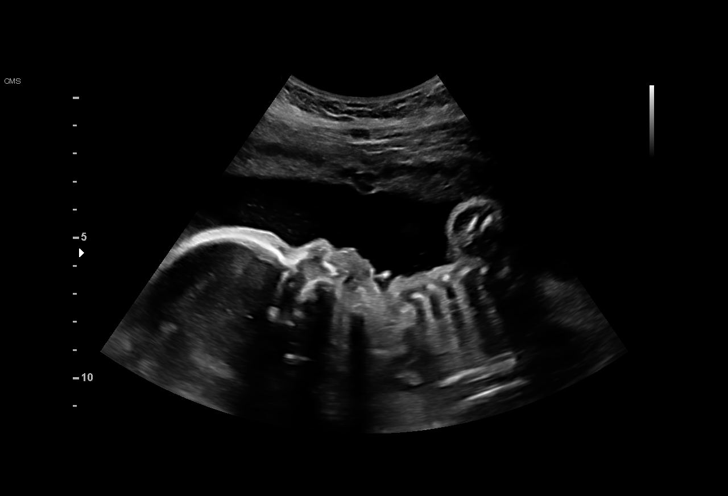
[im 9/75]
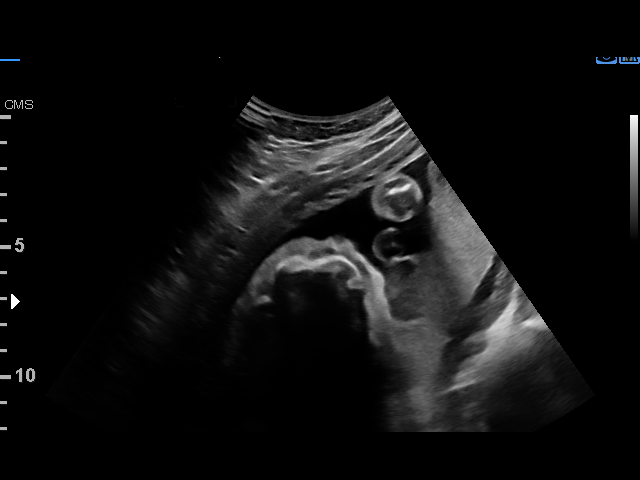
[im 14/75]
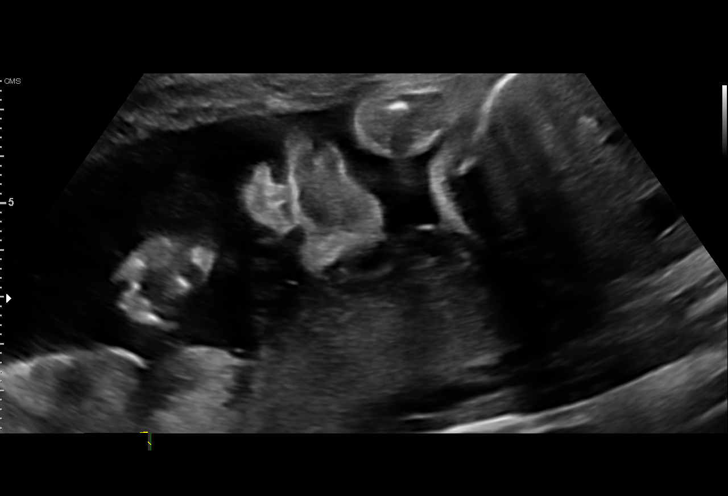
[im 20/75]
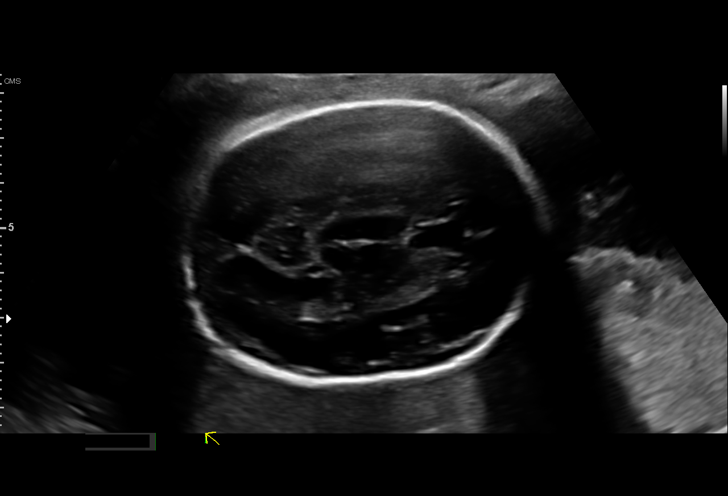
[im 25/75]
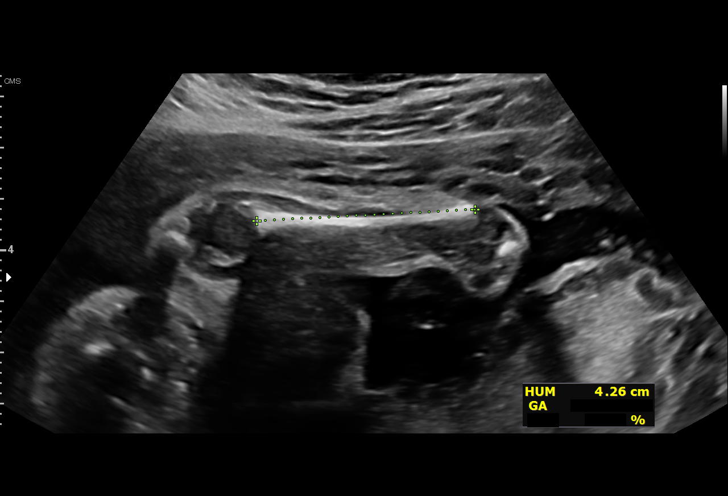
[im 31/75]
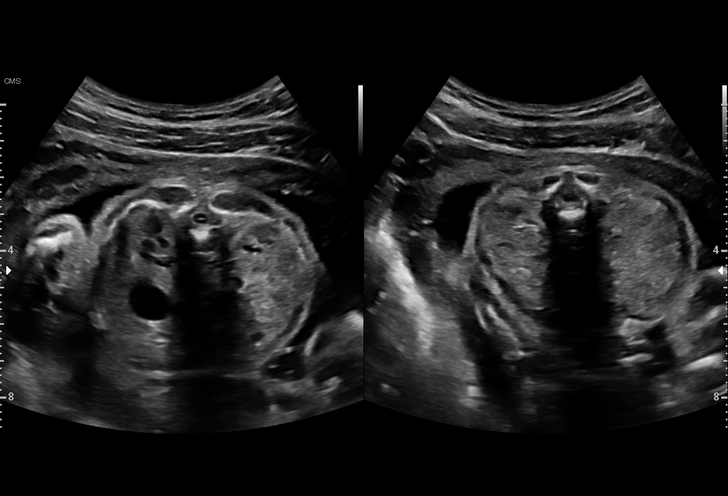
[im 39/75]
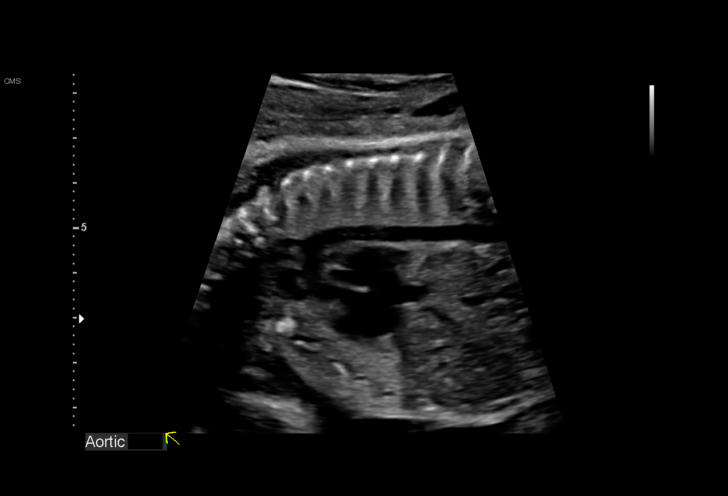
[im 44/75]
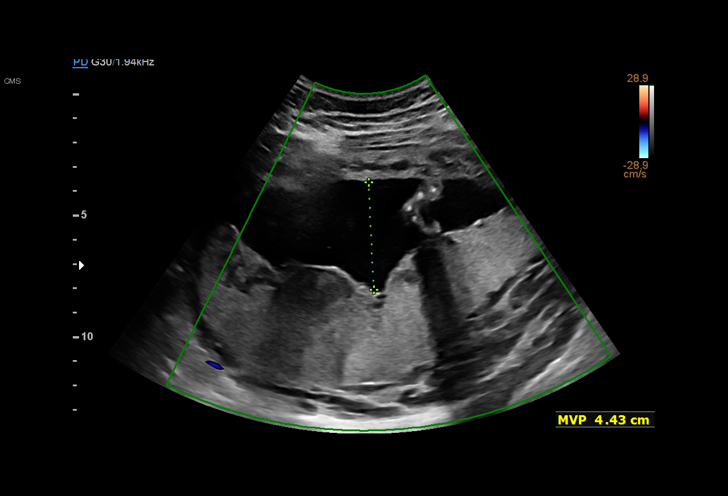
[im 50/75]
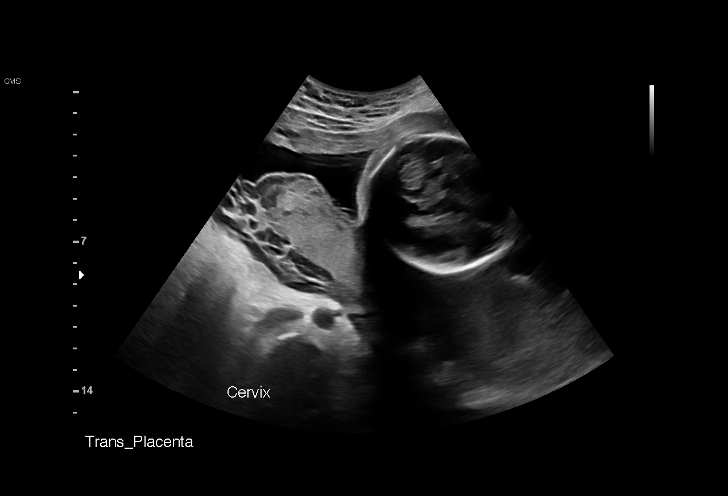
[im 55/75]
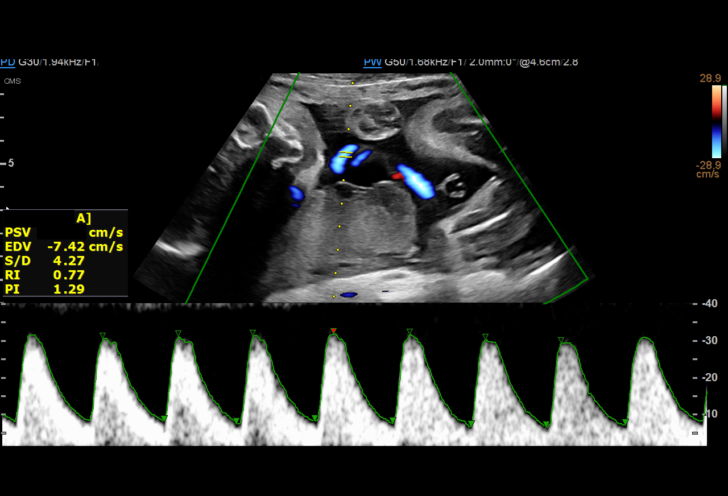
[im 61/75]
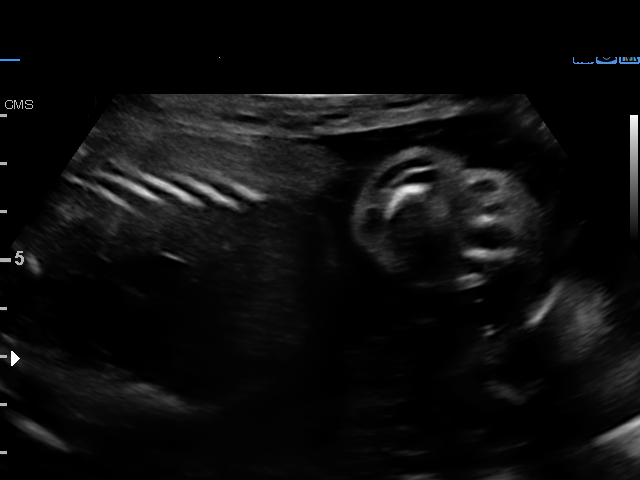
[im 66/75]
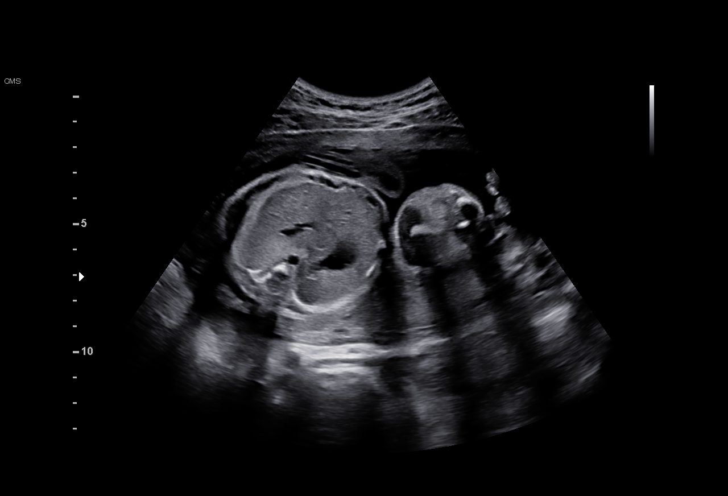
[im 72/75]
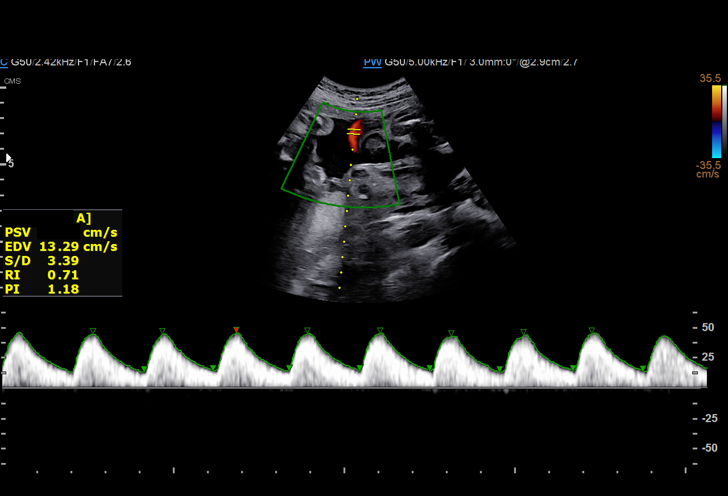

[13 of 28 positions shown; findings below may reference images not displayed]

KLPIGBB
 2  US MFM UA CORD DOPPLER                76820.02    HUERTA
                                                      KLPIGBB

Indications

 Hypertension - Chronic/Pre-existing (ASA)
 Encounter for other antenatal screening
 follow-up
 Small for gestational age fetus affecting
 management of mother
 Rh negative state in antepartum
 26 weeks gestation of pregnancy
Fetal Evaluation

 Num Of Fetuses:         1
 Fetal Heart Rate(bpm):  147
 Cardiac Activity:       Observed
 Presentation:           Breech
 Placenta:               Posterior
 P. Cord Insertion:      Previously Visualized

 Amniotic Fluid
 AFI FV:      Within normal limits

                             Largest Pocket(cm)

Biometry

 BPD:      61.4  mm     G. Age:  25w 0d         11  %    CI:        68.41   %    70 - 86
                                                         FL/HC:      19.8   %    18.6 -
 HC:      237.3  mm     G. Age:  25w 5d         19  %    HC/AC:      1.20        1.04 -
 AC:       197   mm     G. Age:  24w 3d          6  %    FL/BPD:     76.4   %    71 - 87
 FL:       46.9  mm     G. Age:  25w 4d         25  %    FL/AC:      23.8   %    20 - 24
 HUM:      42.6  mm     G. Age:  25w 4d         34  %
 LV:        2.9  mm

 Est. FW:     759  gm    1 lb 11 oz       9  %
OB History

 Gravidity:    1         Term:   0
 Living:       0
Gestational Age

 U/S Today:     25w 1d                                        EDD:   03/13/21
 Best:          26w 0d     Det. By:  Previous Ultrasound      EDD:   03/07/21
                                     (08/02/20)
Anatomy

 Cranium:               Appears normal         LVOT:                   Appears normal
 Cavum:                 Appears normal         Aortic Arch:            Appears normal
 Ventricles:            Appears normal         Ductal Arch:            Previously seen
 Choroid Plexus:        Appears normal         Diaphragm:              Appears normal
 Cerebellum:            Appears normal         Stomach:                Appears normal, left
                                                                       sided
 Posterior Fossa:       Appears normal         Abdomen:                Previously seen
 Nuchal Fold:           Previously seen        Abdominal Wall:         Previously seen
 Face:                  Appears normal         Cord Vessels:           Previously seen
                        (orbits and profile)
 Lips:                  Appears normal         Kidneys:                Appear normal
 Palate:                Appears normal         Bladder:                Appears normal
 Thoracic:              Appears normal         Spine:                  Previously seen
 Heart:                 Previously seen        Upper Extremities:      Previously seen
 RVOT:                  Previously seen        Lower Extremities:      Previously seen

 Other:  Lenses visualized. Nasal bone visualized.
Doppler - Fetal Vessels

 Umbilical Artery
  S/D     %tile      RI    %tile      PI    %tile            ADFV    RDFV
  3.[REDACTED]   > 97.5    1.18       73               No      No

Cervix Uterus Adnexa

 Cervix
 Length:           3.61  cm.
 Normal appearance by transabdominal scan.
 Uterus
 No abnormality visualized.

 Right Ovary
 Not visualized.

 Left Ovary
 Not visualized.

 Cul De Sac
 No free fluid seen.

 Adnexa
 No abnormality visualized.
Comments

 This patient was seen for a follow up growth scan due to
 chronic hypertension that is not currently treated with any
 medications.  She denies any problems since her last exam
 and reports feeling vigorous fetal movements throughout the
 day.
 On today's exam, the EFW measures at the 9th percentile for
 her gestational age indicating fetal growth restriction.  There
 was normal amniotic fluid noted.
 Doppler studies of the umbilical arteries showed a normal
 S/D ratio of 3.39. There were no signs of absent or reversed
 end-diastolic flow.
 Due to fetal growth restriction, another umbilical artery
 Doppler study was scheduled in 2 weeks.  We will reassess
 the fetal growth again in 3 weeks.  We will also start weekly
 nonstress tests at around 28 weeks.

## 2021-10-25 DIAGNOSIS — Z20822 Contact with and (suspected) exposure to covid-19: Secondary | ICD-10-CM | POA: Diagnosis not present

## 2021-10-25 DIAGNOSIS — Z1159 Encounter for screening for other viral diseases: Secondary | ICD-10-CM | POA: Diagnosis not present

## 2021-10-25 DIAGNOSIS — E559 Vitamin D deficiency, unspecified: Secondary | ICD-10-CM | POA: Diagnosis not present

## 2021-10-25 DIAGNOSIS — R87610 Atypical squamous cells of undetermined significance on cytologic smear of cervix (ASC-US): Secondary | ICD-10-CM | POA: Diagnosis not present

## 2021-10-25 DIAGNOSIS — N898 Other specified noninflammatory disorders of vagina: Secondary | ICD-10-CM | POA: Diagnosis not present

## 2021-10-25 DIAGNOSIS — Z6822 Body mass index (BMI) 22.0-22.9, adult: Secondary | ICD-10-CM | POA: Diagnosis not present

## 2021-10-25 DIAGNOSIS — R0602 Shortness of breath: Secondary | ICD-10-CM | POA: Diagnosis not present

## 2021-10-25 DIAGNOSIS — Z79899 Other long term (current) drug therapy: Secondary | ICD-10-CM | POA: Diagnosis not present

## 2021-10-25 DIAGNOSIS — Z0000 Encounter for general adult medical examination without abnormal findings: Secondary | ICD-10-CM | POA: Diagnosis not present

## 2021-10-25 DIAGNOSIS — R5383 Other fatigue: Secondary | ICD-10-CM | POA: Diagnosis not present

## 2021-11-08 DIAGNOSIS — E559 Vitamin D deficiency, unspecified: Secondary | ICD-10-CM | POA: Diagnosis not present

## 2021-12-12 ENCOUNTER — Encounter (HOSPITAL_BASED_OUTPATIENT_CLINIC_OR_DEPARTMENT_OTHER): Payer: Self-pay | Admitting: Obstetrics and Gynecology

## 2021-12-12 ENCOUNTER — Emergency Department (HOSPITAL_BASED_OUTPATIENT_CLINIC_OR_DEPARTMENT_OTHER)
Admission: EM | Admit: 2021-12-12 | Discharge: 2021-12-12 | Disposition: A | Discharge status: Home / Self Care | Payer: Medicaid Other | Attending: Emergency Medicine | Admitting: Emergency Medicine

## 2021-12-12 ENCOUNTER — Telehealth: Payer: Self-pay | Admitting: *Deleted

## 2021-12-12 ENCOUNTER — Other Ambulatory Visit: Payer: Self-pay

## 2021-12-12 DIAGNOSIS — O26899 Other specified pregnancy related conditions, unspecified trimester: Secondary | ICD-10-CM | POA: Diagnosis present

## 2021-12-12 DIAGNOSIS — Z3A00 Weeks of gestation of pregnancy not specified: Secondary | ICD-10-CM | POA: Diagnosis not present

## 2021-12-12 DIAGNOSIS — O9123 Nonpurulent mastitis associated with lactation: Secondary | ICD-10-CM | POA: Diagnosis not present

## 2021-12-12 DIAGNOSIS — N61 Mastitis without abscess: Secondary | ICD-10-CM | POA: Diagnosis not present

## 2021-12-12 MED ORDER — DICLOXACILLIN SODIUM 500 MG PO CAPS: 500.0000 mg | ORAL_CAPSULE | Freq: Four times a day (QID) | ORAL | 0 refills | Status: DC

## 2021-12-12 NOTE — Discharge Instructions (Addendum)
Please take antibiotic as prescribed ?You may use warm packs and Tylenol for pain ?Recheck with your doctor in 3 to 4 days if you are continue to have pain or any other symptoms of infection ?Return if you are having spreading redness, fever, or worsening pain ?

## 2021-12-12 NOTE — Telephone Encounter (Signed)
Received TC from pt at 10:04 AM Reporting possible mastitis. Prior to returning call at 1:30 PM RN noted pt has been seen and treated at [facility] ED site. TC to confirm. No answer. LVM. ?

## 2021-12-12 NOTE — ED Triage Notes (Signed)
Patient reports to the ER for right breast pain. She is actively breast feeding and has been for x9 months. Encouraged to come be evaluated for mastitis.  ?

## 2021-12-12 NOTE — ED Provider Notes (Signed)
?[facility] EMERGENCY DEPT ?Provider Note ? ? ?CSN: [id number] ?Arrival date & time: 12/12/21  1215 ? ?  ? ?History ? ?Chief Complaint  ?Patient presents with  ? Breast Pain  ? ? ?Anita Hoover is a 26 y.o. female. ? ?HPI ?26 year old female G1, P1 who is currently breast-feeding presents today complaining of right-sided breast pain that began yesterday.  She did have fever, chills, redness, induration, or abnormal discharge.  She has not had nausea or vomiting fever or chills.  She is continuing to breast-feed ?  ? ?Home Medications ?Prior to Admission medications   ?Medication Sig Start Date End Date Taking? Authorizing Provider  ?dicloxacillin (DYNAPEN) 500 MG capsule Take 1 capsule (500 mg total) by mouth 4 (four) times daily. 12/12/21  Yes Pattricia Boss, MD  ?docusate sodium (COLACE) 100 MG capsule Take 1 capsule (100 mg total) by mouth 2 (two) times daily as needed for mild constipation. ?Patient not taking: Reported on 04/06/2021 03/05/21   Osborne Oman, MD  ?Drospirenone (SLYND) 4 MG TABS Take 1 tablet by mouth daily. 04/06/21   Rasch, Anderson Malta I, NP  ?ferrous sulfate 325 (65 FE) MG tablet Take 1 tablet (325 mg total) by mouth every other day. ?Patient not taking: Reported on 04/06/2021 03/05/21   Osborne Oman, MD  ?ibuprofen (ADVIL) 600 MG tablet Take 1 tablet (600 mg total) by mouth every 6 (six) hours. ?Patient not taking: Reported on 04/06/2021 03/05/21   Osborne Oman, MD  ?NIFEdipine (ADALAT CC) 30 MG 24 hr tablet Take 2 tablets (60 mg total) by mouth daily. ?Patient not taking: Reported on 04/06/2021 03/10/21   Woodroe Mode, MD  ?   ? ?Allergies    ?Patient has no known allergies.   ? ?Review of Systems   ?Review of Systems ? ?Physical Exam ?Updated Vital Signs ?BP (!) 137/93 (BP Location: Right Arm)   Pulse (!) 102   Temp 98.7 ?F (37.1 ?C)   Resp 16   Ht 1.651 m (5\' 5" )   Wt 65 kg   LMP 11/22/2021 (Approximate)   SpO2 100%   Breastfeeding Yes   BMI 23.85 kg/m?  ?Physical  Exam ?Vitals and nursing note reviewed.  ?Constitutional:   ?   General: She is not in acute distress. ?   Appearance: She is well-developed.  ?HENT:  ?   Head: Normocephalic and atraumatic.  ?   Right Ear: External ear normal.  ?   Left Ear: External ear normal.  ?   Nose: Nose normal.  ?Eyes:  ?   Conjunctiva/sclera: Conjunctivae normal.  ?   Pupils: Pupils are equal, round, and reactive to light.  ?Pulmonary:  ?   Effort: Pulmonary effort is normal.  ?Genitourinary: ?   Comments: Right breast is tender to palpation in the lateral third ?There is no redness or induration noted ?No discharge noted ?Musculoskeletal:     ?   General: Normal range of motion.  ?   Cervical back: Normal range of motion and neck supple.  ?Skin: ?   General: Skin is warm and dry.  ?Neurological:  ?   Mental Status: She is alert and oriented to person, place, and time.  ?   Motor: No abnormal muscle tone.  ?   Coordination: Coordination normal.  ?Psychiatric:     ?   Behavior: Behavior normal.     ?   Thought Content: Thought content normal.  ? ? ?ED Results / Procedures / Treatments   ?Labs ?(  all labs ordered are listed, but only abnormal results are displayed) ?Labs Reviewed - No data to display ? ?EKG ?None ? ?Radiology ?No results found. ? ?Procedures ?Procedures  ? ? ?Medications Ordered in ED ?Medications - No data to display ? ?ED Course/ Medical Decision Making/ A&P ?  ?                        ?Medical Decision Making ?26 year old female actively breast-feeding with right breast pain. ?Differential diagnosis includes lactation mastitis, other infection including abscess, masses, and trauma.  Patient reports no history of trauma and no palpable masses are noted. ?Given current lactation status symptoms most consistent with lactation mastitis. ?Plan dicloxacillin 4 times daily, warm compresses, and continued breast-feeding. ?Discussed return precautions and need for follow-up with patient and she voices  understanding. ? ?Risk ?Prescription drug management. ? ? ? ? ? ? ? ? ?Final Clinical Impression(s) / ED Diagnoses ?Final diagnoses:  ?Nonpurulent mastitis associated with lactation  ? ? ?Rx / DC Orders ?ED Discharge Orders   ? ?      Ordered  ?  dicloxacillin (DYNAPEN) 500 MG capsule  4 times daily       ? 12/12/21 1235  ? ?  ?  ? ?  ? ? ?  ?Pattricia Boss, MD ?12/12/21 1239 ? ?

## 2022-03-13 ENCOUNTER — Other Ambulatory Visit: Payer: Self-pay | Admitting: Family Medicine

## 2022-03-19 DIAGNOSIS — B9689 Other specified bacterial agents as the cause of diseases classified elsewhere: Secondary | ICD-10-CM | POA: Diagnosis not present

## 2022-03-19 DIAGNOSIS — N760 Acute vaginitis: Secondary | ICD-10-CM | POA: Diagnosis not present

## 2022-03-19 DIAGNOSIS — N898 Other specified noninflammatory disorders of vagina: Secondary | ICD-10-CM | POA: Diagnosis not present

## 2022-03-23 ENCOUNTER — Ambulatory Visit (HOSPITAL_BASED_OUTPATIENT_CLINIC_OR_DEPARTMENT_OTHER): Payer: Medicaid Other | Admitting: Nurse Practitioner

## 2022-03-23 ENCOUNTER — Encounter (HOSPITAL_BASED_OUTPATIENT_CLINIC_OR_DEPARTMENT_OTHER): Payer: Self-pay | Admitting: Nurse Practitioner

## 2022-03-23 VITALS — BP 110/78 | HR 80 | Ht 65.0 in | Wt 124.0 lb

## 2022-03-23 DIAGNOSIS — L659 Nonscarring hair loss, unspecified: Secondary | ICD-10-CM | POA: Diagnosis not present

## 2022-03-23 DIAGNOSIS — E559 Vitamin D deficiency, unspecified: Secondary | ICD-10-CM

## 2022-03-23 DIAGNOSIS — R630 Anorexia: Secondary | ICD-10-CM

## 2022-03-23 DIAGNOSIS — R6889 Other general symptoms and signs: Secondary | ICD-10-CM | POA: Diagnosis not present

## 2022-03-23 DIAGNOSIS — F411 Generalized anxiety disorder: Secondary | ICD-10-CM | POA: Diagnosis not present

## 2022-03-23 DIAGNOSIS — I158 Other secondary hypertension: Secondary | ICD-10-CM

## 2022-03-23 DIAGNOSIS — R634 Abnormal weight loss: Secondary | ICD-10-CM | POA: Diagnosis not present

## 2022-03-23 DIAGNOSIS — F32A Depression, unspecified: Secondary | ICD-10-CM | POA: Diagnosis not present

## 2022-03-23 DIAGNOSIS — F419 Anxiety disorder, unspecified: Secondary | ICD-10-CM

## 2022-03-23 DIAGNOSIS — Z0000 Encounter for general adult medical examination without abnormal findings: Secondary | ICD-10-CM | POA: Diagnosis not present

## 2022-03-23 MED ORDER — VITAMIN D (ERGOCALCIFEROL) 1.25 MG (50000 UNIT) PO CAPS: 50000.0000 [IU] | ORAL_CAPSULE | ORAL | 0 refills | Status: DC
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
## 2022-03-23 NOTE — Patient Instructions (Signed)
Thank you for choosing [place] [facility] for your Primary Care needs. I am excited for the opportunity to partner with you to meet your health care goals. It was a pleasure meeting you today!  Recommendations from today's visit: We will request your records today from [facility] and I will go over her most recent labs.  We can plan to do a virtual visit in the next couple of weeks and discuss the findings from those labs as well as from the GeneSight testing.  I can always wait to schedule this until I get the lab results back which may help prevent you from waiting as long if they come in sooner.  Information on diet, exercise, and health maintenance recommendations are listed below. This is information to help you be sure you are on track for optimal health and monitoring.   Please look over this and let us know if you have any questions or if you have completed any of the health maintenance outside of [place] so that we can be sure your records are up to date.  ___________________________________________________________ About Me: I am an Adult-Geriatric Nurse Practitioner with a background in caring for patients for more than 20 years with a strong intensive care background. I provide primary care and sports medicine services to patients age 64 and older within this office. My education had a strong focus on caring for the older adult population, which I am passionate about. I am also the director of the APP Fellowship with [facility].   My desire is to provide you with the best service through preventive medicine and supportive care. I consider you a part of the medical team and value your input. I work diligently to ensure that you are heard and your needs are met in a safe and effective manner. I want you to feel comfortable with me as your provider and want you to know that your health concerns are important to me.  For your information, our office  hours are: Monday, Tuesday, and Thursday 8:00 AM - 5:00 PM Wednesday and Friday 8:00 AM - 12:00 PM.   In my time away from the office I am teaching new APP's within the system and am unavailable, but my partner, Dr. Burnard Bunting is in the office for emergent needs.   If you have questions or concerns, please call our office at [phone] or send [place] a MyChart message and we will respond as quickly as possible.  ____________________________________________________________ MyChart:  For all urgent or time sensitive needs we ask that you please call the office to avoid delays. Our number is [phone]. MyChart is not constantly monitored and due to the large volume of messages a day, replies may take up to 72 business hours.  MyChart Policy: MyChart allows for you to see your visit notes, after visit summary, provider recommendations, lab and tests results, make an appointment, request refills, and contact your provider or the office for non-urgent questions or concerns. Providers are seeing patients during normal business hours and do not have built in time to review MyChart messages.  We ask that you allow a minimum of 3 business days for responses to [employer]. For this reason, please do not send urgent requests through [place]. Please call the office at [phone]. New and ongoing conditions may require a visit. We have virtual and in person visit available for your convenience.  Complex MyChart concerns may require a visit. Your provider may request you schedule  a virtual or in person visit to ensure we are providing the best care possible. MyChart messages sent after 11:00 AM on Friday will not be received by the provider until Monday morning.    Lab and Test Results: You will receive your lab and test results on MyChart as soon as they are completed and results have been sent by the lab or testing facility. Due to this service, you will receive your results BEFORE your provider.  I  review lab and tests results each morning prior to seeing patients. Some results require collaboration with other providers to ensure you are receiving the most appropriate care. For this reason, we ask that you please allow a minimum of 3-5 business days from the time the ALL results have been received for your provider to receive and review lab and test results and contact you about these.  Most lab and test result comments from the provider will be sent through [place]. Your provider may recommend changes to the plan of care, follow-up visits, repeat testing, ask questions, or request an office visit to discuss these results. You may reply directly to this message or call the office at [phone] to provide information for the provider or set up an appointment. In some instances, you will be called with test results and recommendations. Please let us know if this is preferred and we will make note of this in your chart to provide this for you.    If you have not heard a response to your lab or test results in 5 business days from all results returning to [place], please call the office to let us know. We ask that you please avoid calling prior to this time unless there is an emergent concern. Due to high call volumes, this can delay the resulting process.  After Hours: For all non-emergency after hours needs, please call the office at [phone] and select the option to reach the on-call provider service. On-call services are shared between multiple [place] offices and therefore it will not be possible to speak directly with your provider. On-call providers may provide medical advice and recommendations, but are unable to provide refills for maintenance medications.  For all emergency or urgent medical needs after normal business hours, we recommend that you seek care at the closest Urgent Care or Emergency Department to ensure appropriate treatment in a timely manner.  [facility] at  [place] has a 24 hour emergency room located on the ground floor for your convenience.   Urgent Concerns During the Business Day Providers are seeing patients from 8AM to [place] with a busy schedule and are most often not able to respond to non-urgent calls until the end of the day or the next business day. If you should have URGENT concerns during the day, please call and speak to the nurse or schedule a same day appointment so that we can address your concern without delay.   Thank you, again, for choosing me as your health care partner. I appreciate your trust and look forward to learning more about you.   Worthy Keeler, DNP, AGNP-c ___________________________________________________________  Health Maintenance Recommendations Screening Testing Mammogram Every 1 -2 years based on history and risk factors Starting at age 61 Pap Smear Ages 21-39 every 3 years Ages 35-65 every 5 years with HPV testing More frequent testing may be required based on results and history Colon Cancer Screening Every 1-10 years based on test performed, risk factors, and history Starting at age 11 Bone Density  Screening Every 2-10 years based on history Starting at age 28 for women Recommendations for men differ based on medication usage, history, and risk factors AAA Screening One time ultrasound Men 45-99 years old who have every smoked Lung Cancer Screening Low Dose Lung CT every 12 months Age 83-80 years with a 30 pack-year smoking history who still smoke or who have quit within the last 15 years  Screening Labs Routine  Labs: Complete Blood Count (CBC), Complete Metabolic Panel (CMP), Cholesterol (Lipid Panel) Every 6-12 months based on history and medications May be recommended more frequently based on current conditions or previous results Hemoglobin A1c Lab Every 3-12 months based on history and previous results Starting at age 65 or earlier with diagnosis of diabetes, high cholesterol, BMI  >26, and/or risk factors Frequent monitoring for patients with diabetes to ensure blood sugar control Thyroid Panel (TSH w/ T3 & T4) Every 6 months based on history, symptoms, and risk factors May be repeated more often if on medication HIV One time testing for all patients 40 and older May be repeated more frequently for patients with increased risk factors or exposure Hepatitis C One time testing for all patients 50 and older May be repeated more frequently for patients with increased risk factors or exposure Gonorrhea, Chlamydia Every 12 months for all sexually active persons 13-24 years Additional monitoring may be recommended for those who are considered high risk or who have symptoms PSA Men 67-17 years old with risk factors Additional screening may be recommended from age 52-69 based on risk factors, symptoms, and history  Vaccine Recommendations Tetanus Booster All adults every 10 years Flu Vaccine All patients 6 months and older every year COVID Vaccine All patients 12 years and older Initial dosing with booster May recommend additional booster based on age and health history HPV Vaccine 2 doses all patients age 38-26 Dosing may be considered for patients over 26 Shingles Vaccine (Shingrix) 2 doses all adults 22 years and older Pneumonia (Pneumovax 23) All adults 35 years and older May recommend earlier dosing based on health history Pneumonia (Prevnar 27) All adults 43 years and older Dosed 1 year after Pneumovax 23  Additional Screening, Testing, and Vaccinations may be recommended on an individualized basis based on family history, health history, risk factors, and/or exposure.  __________________________________________________________  Diet Recommendations for All Patients  I recommend that all patients maintain a diet low in saturated fats, carbohydrates, and cholesterol. While this can be challenging at first, it is not impossible and small changes can make  big differences.  Things to try: Decreasing the amount of soda, sweet tea, and/or juice to one or less per day and replace with water While water is always the first choice, if you do not like water you may consider adding a water additive without sugar to improve the taste other sugar free drinks Replace potatoes with a brightly colored vegetable at dinner Use healthy oils, such as canola oil or olive oil, instead of butter or hard margarine Limit your bread intake to two pieces or less a day Replace regular pasta with low carb pasta options Bake, broil, or grill foods instead of frying Monitor portion sizes  Eat smaller, more frequent meals throughout the day instead of large meals  An important thing to remember is, if you love foods that are not great for your health, you don't have to give them up completely. Instead, allow these foods to be a reward when you have done well. Allowing yourself to  still have special treats every once in a while is a nice way to tell yourself thank you for working hard to keep yourself healthy.   Also remember that every day is a new day. If you have a bad day and "fall off the wagon", you can still climb right back up and keep moving along on your journey!  We have resources available to help you!  Some websites that may be helpful include: www.http://carter.biz/  Www.VeryWellFit.com _____________________________________________________________  Activity Recommendations for All Patients  I recommend that all adults get at least 20 minutes of moderate physical activity that elevates your heart rate at least 5 days out of the week.  Some examples include: Walking or jogging at a pace that allows you to carry on a conversation Cycling (stationary bike or outdoors) Water aerobics Yoga Weight lifting Dancing If physical limitations prevent you from putting stress on your joints, exercise in a pool or seated in a chair are excellent options.  Do determine  your MAXIMUM heart rate for activity: YOUR AGE - 220 = MAX HeartRate   Remember! Do not push yourself too hard.  Start slowly and build up your pace, speed, weight, time in exercise, etc.  Allow your body to rest between exercise and get good sleep. You will need more water than normal when you are exerting yourself. Do not wait until you are thirsty to drink. Drink with a purpose of getting in at least 8, 8 ounce glasses of water a day plus more depending on how much you exercise and sweat.    If you begin to develop dizziness, chest pain, abdominal pain, jaw pain, shortness of breath, headache, vision changes, lightheadedness, or other concerning symptoms, stop the activity and allow your body to rest. If your symptoms are severe, seek emergency evaluation immediately. If your symptoms are concerning, but not severe, please let us know so that we can recommend further evaluation.

## 2022-04-05 ENCOUNTER — Ambulatory Visit (INDEPENDENT_AMBULATORY_CARE_PROVIDER_SITE_OTHER): Payer: Medicaid Other | Admitting: Nurse Practitioner

## 2022-04-05 ENCOUNTER — Encounter (HOSPITAL_BASED_OUTPATIENT_CLINIC_OR_DEPARTMENT_OTHER): Payer: Self-pay | Admitting: Nurse Practitioner

## 2022-04-05 ENCOUNTER — Ambulatory Visit: Payer: Medicaid Other | Admitting: Obstetrics and Gynecology

## 2022-04-05 NOTE — Progress Notes (Signed)
Virtual Visit Encounter telephone visit. Left voicemail at [phone]  Need labs from [facility]- not here yet- call tomorrow at 1pm   I connected with  Audrie Gallus on 04/05/22 at  2:10 PM EDT by secure audio telemedicine application. I verified that I am speaking with the correct person using two identifiers.   I introduced myself as a Publishing rights manager with the practice. The limitations of evaluation and management by telemedicine discussed with the patient and the availability of in person appointments. The patient expressed verbal understanding and consent to proceed.  Participating parties in this visit include: Myself and patient  The patient is: Patient Location: Home I am: Provider Location: Office/Clinic Subjective:    CC and HPI: Anita Hoover is a 26 y.o. year old female presenting for discussion of labs from her previous PCP.  Patient reports the following: Alese did have labs completed at [facility] prior to transitioning her care. She has requested that those records be sent to our office and she would like to discuss the lab results today. Unfortunately, these records have not been received and she does not have the information to supply to me at this time.  We will plan to request records in the morning and ask that these be faxed to the office for me to review. If records are received, I will call her tomorrow to discuss the findings and recommendations.  Plan to call after 1pm tomorrow.   Past medical history, Surgical history, Family history not pertinant except as noted below, Social history, Allergies, and medications have been entered into the medical record, reviewed, and corrections made.   Review of Systems:  All review of systems negative except what is listed in the HPI  Objective:    Alert and oriented x 4 Speaking in clear sentences with no shortness of breath. No distress.  Impression and Recommendations:    Problem List Items Addressed This  Visit   None   the following changes are made - await labs and follow-up tomorrow I discussed the assessment and treatment plan with the patient. The patient was provided an opportunity to ask questions and all were answered. The patient agreed with the plan and demonstrated an understanding of the instructions.   The patient was advised to call back or seek an in-person evaluation if the symptoms worsen or if the condition fails to improve as anticipated.  Follow-Up: tomorrow  I provided 14 minutes of non-face-to-face interaction with this non face-to-face encounter including intake, same-day documentation, and chart review.   Tollie Eth, NP , DNP, AGNP-c [facility] at [facility] [phone] (fax)

## 2022-04-25 ENCOUNTER — Encounter (HOSPITAL_BASED_OUTPATIENT_CLINIC_OR_DEPARTMENT_OTHER): Payer: Self-pay | Admitting: Nurse Practitioner

## 2022-04-25 DIAGNOSIS — R6889 Other general symptoms and signs: Secondary | ICD-10-CM | POA: Insufficient documentation

## 2022-04-25 DIAGNOSIS — R630 Anorexia: Secondary | ICD-10-CM | POA: Insufficient documentation

## 2022-04-25 DIAGNOSIS — L659 Nonscarring hair loss, unspecified: Secondary | ICD-10-CM | POA: Insufficient documentation

## 2022-04-25 DIAGNOSIS — F32A Anxiety disorder, unspecified: Secondary | ICD-10-CM | POA: Insufficient documentation

## 2022-04-25 DIAGNOSIS — R634 Abnormal weight loss: Secondary | ICD-10-CM | POA: Insufficient documentation

## 2022-04-25 NOTE — Assessment & Plan Note (Signed)
Etiology unclear. Possibly related to anxiety. Will review labs.

## 2022-04-25 NOTE — Assessment & Plan Note (Signed)
Possibly related to recent pregnancy and breast feeding. Also consider possible thyroid etiology. At this time we will plan to get labs from former PCP and review. May need to bring back in for labs if thyroid and vitamins not checked.

## 2022-04-25 NOTE — Assessment & Plan Note (Signed)
Reported significant unintentional weight loss since the birth of child 1 year ago. She has been breast feeding but has transitioned to night only feeding. Unclear at this time if this is related to breast feeding or underlying concerns. She recently had labs with former PCP- I will see if we can get those records. Would like to ensure that her thyroid has been checked given multiple symptoms. Will plan to f/u following review.

## 2022-04-25 NOTE — Assessment & Plan Note (Signed)
Increased anxiety symptoms since the birth of child superimposed over underlying anxiety. At this time she is breast feeding at night only and does not wish to pursue medications, but she is interested in counseling. I will send a referral for her today as I do feel this could be beneficial. No alarm sx present at this time.

## 2022-04-25 NOTE — Assessment & Plan Note (Signed)
Etiology unknown. Will review labs from former PCP

## 2022-04-25 NOTE — Assessment & Plan Note (Signed)
Present during pregnancy and in immediate post partum period. BP normal today with no alarm sx present. Will plan to resolve this concern at this time, but monitor closely.

## 2022-04-25 NOTE — Assessment & Plan Note (Signed)
Worsening since birth of child. Referral to counseling. Medication declined at this time. No evidence of SI/HI/Self harm or risks.

## 2022-05-10 ENCOUNTER — Encounter (HOSPITAL_COMMUNITY): Payer: Self-pay

## 2022-05-10 ENCOUNTER — Ambulatory Visit (INDEPENDENT_AMBULATORY_CARE_PROVIDER_SITE_OTHER): Payer: Medicaid Other | Admitting: Mental Health

## 2022-05-10 DIAGNOSIS — F322 Major depressive disorder, single episode, severe without psychotic features: Secondary | ICD-10-CM

## 2022-05-10 DIAGNOSIS — F4310 Post-traumatic stress disorder, unspecified: Secondary | ICD-10-CM

## 2022-05-10 DIAGNOSIS — F411 Generalized anxiety disorder: Secondary | ICD-10-CM
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
## 2022-05-15 ENCOUNTER — Ambulatory Visit (INDEPENDENT_AMBULATORY_CARE_PROVIDER_SITE_OTHER): Payer: Medicaid Other | Admitting: General Practice

## 2022-05-15 ENCOUNTER — Other Ambulatory Visit (HOSPITAL_COMMUNITY)
Admission: RE | Admit: 2022-05-15 | Discharge: 2022-05-15 | Disposition: A | Discharge status: Home / Self Care | Payer: Medicaid Other | Source: Ambulatory Visit | Attending: Obstetrics and Gynecology | Admitting: Obstetrics and Gynecology

## 2022-05-15 VITALS — BP 132/77 | HR 81 | Ht 65.0 in | Wt 123.7 lb

## 2022-05-15 DIAGNOSIS — N898 Other specified noninflammatory disorders of vagina: Secondary | ICD-10-CM | POA: Diagnosis not present

## 2022-05-15 LAB — POCT URINALYSIS DIPSTICK
Bilirubin, UA: NEGATIVE
Blood, UA: NEGATIVE
Glucose, UA: NEGATIVE
Ketones, UA: NEGATIVE
Leukocytes, UA: NEGATIVE
Nitrite, UA: NEGATIVE
Protein, UA: NEGATIVE
Spec Grav, UA: 1.015 (ref 1.010–1.025)
Urobilinogen, UA: 0.2 E.U./dL
pH, UA: 6 (ref 5.0–8.0)

## 2022-05-15 NOTE — Progress Notes (Signed)
SUBJECTIVE:  26 y.o. female complains of white vaginal discharge for 1 week(s). Denies abnormal vaginal bleeding or significant pelvic pain or fever. No UTI symptoms. Denies history of known exposure to STD.  No LMP recorded.  OBJECTIVE:  She appears well, afebrile. Urine dipstick: negative for all components.  ASSESSMENT:  Vaginal Discharge  Vaginal Odor   PLAN:  GC, chlamydia, trichomonas, BVAG, CVAG probe sent to lab. Treatment: To be determined once lab results are received ROV prn if symptoms persist or worsen.

## 2022-05-17 LAB — URINE CULTURE

## 2022-05-18 LAB — CERVICOVAGINAL ANCILLARY ONLY
Bacterial Vaginitis (gardnerella): POSITIVE — AB
Chlamydia: NEGATIVE
Comment: NEGATIVE
Comment: NEGATIVE
Comment: NORMAL
Neisseria Gonorrhea: NEGATIVE

## 2022-05-21 MED ORDER — METRONIDAZOLE 500 MG PO TABS: 500.0000 mg | ORAL_TABLET | Freq: Two times a day (BID) | ORAL | 0 refills | Status: DC

## 2022-05-21 NOTE — Addendum Note (Signed)
Addended by: Mora Bellman on: 05/21/2022 08:58 AM   Modules accepted: Orders

## 2022-05-22 ENCOUNTER — Telehealth: Payer: Self-pay | Admitting: Emergency Medicine

## 2022-05-22 NOTE — Telephone Encounter (Signed)
Attempted RC to patient, LVM.

## 2022-05-24 ENCOUNTER — Ambulatory Visit (INDEPENDENT_AMBULATORY_CARE_PROVIDER_SITE_OTHER): Payer: Medicaid Other | Admitting: Mental Health

## 2022-05-24 DIAGNOSIS — F322 Major depressive disorder, single episode, severe without psychotic features: Secondary | ICD-10-CM

## 2022-05-24 DIAGNOSIS — F4310 Post-traumatic stress disorder, unspecified: Secondary | ICD-10-CM | POA: Diagnosis not present
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
## 2022-06-06 ENCOUNTER — Other Ambulatory Visit: Payer: Self-pay | Admitting: Emergency Medicine

## 2022-06-06 ENCOUNTER — Encounter: Payer: Self-pay | Admitting: Emergency Medicine

## 2022-06-06 MED ORDER — SLYND 4 MG PO TABS: 1.0000 | ORAL_TABLET | Freq: Every day | ORAL | 1 refills | Status: DC

## 2022-06-06 NOTE — Progress Notes (Signed)
Rx for Birth Control until Annual on 12/14

## 2022-06-07 ENCOUNTER — Other Ambulatory Visit: Payer: Self-pay | Admitting: Emergency Medicine

## 2022-06-07 DIAGNOSIS — N898 Other specified noninflammatory disorders of vagina: Secondary | ICD-10-CM

## 2022-06-07 MED ORDER — FLUCONAZOLE 150 MG PO TABS: 150.0000 mg | ORAL_TABLET | Freq: Once | ORAL | 0 refills | Status: AC

## 2022-06-07 NOTE — Progress Notes (Signed)
TC from patient with c/o vaginal itching, irriation following completion of metronidazole for BV treatment. Treated for yeast infection per protocol.

## 2022-06-12 ENCOUNTER — Ambulatory Visit (INDEPENDENT_AMBULATORY_CARE_PROVIDER_SITE_OTHER): Payer: Medicaid Other | Admitting: Mental Health

## 2022-06-12 DIAGNOSIS — F4310 Post-traumatic stress disorder, unspecified: Secondary | ICD-10-CM

## 2022-06-12 DIAGNOSIS — F322 Major depressive disorder, single episode, severe without psychotic features: Secondary | ICD-10-CM
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
## 2022-06-14 ENCOUNTER — Ambulatory Visit (INDEPENDENT_AMBULATORY_CARE_PROVIDER_SITE_OTHER): Payer: Medicaid Other | Admitting: Student

## 2022-06-14 VITALS — BP 122/97 | HR 63 | Ht 67.0 in | Wt 123.0 lb

## 2022-06-14 DIAGNOSIS — F411 Generalized anxiety disorder: Secondary | ICD-10-CM | POA: Diagnosis not present

## 2022-06-14 DIAGNOSIS — F4310 Post-traumatic stress disorder, unspecified: Secondary | ICD-10-CM | POA: Diagnosis not present

## 2022-06-14 DIAGNOSIS — F322 Major depressive disorder, single episode, severe without psychotic features: Secondary | ICD-10-CM

## 2022-06-14 MED ORDER — SERTRALINE HCL 50 MG PO TABS: ORAL_TABLET | ORAL | 2 refills | Status: DC

## 2022-06-14 MED ORDER — PROPRANOLOL HCL 10 MG PO TABS: 10.0000 mg | ORAL_TABLET | Freq: Two times a day (BID) | ORAL | 1 refills | Status: DC | PRN

## 2022-06-14 NOTE — Progress Notes (Signed)
Psychiatric Initial Adult Assessment  Patient Identification: Anita Hoover MRN:  [id number] Date of Evaluation:  06/14/2022 Referral Source: Rockey Situ  Assessment:  Anita Hoover is a 26 year old female with a history of PTSD, MDD severe, and GAD, diagnosed by therapist Rockey Situ whom the patient began seeing in October of this year. She presents to the [facility] for initial evaluation of anxiety. The patient reports symptoms consistent with GAD and PTSD. She does not appear to meet criteria for major depressive disorder.    Plan:  # GAD Past medication trials: Prozac and Zoloft, unsure if either was effective Status of problem: active Interventions: -- Start Zoloft 25 mg x1 week then increase to 50 mg thereafter  -Discussed transient side effects and potential sexual adverse effects -- Propranolol 10 mg BID PRN for anxiety and panic attacks  -Discussed possible orthostasis with this medication and the need for caution -- RTC 4 weeks  # PTSD Past medication trials: none Status of problem: active Interventions: Denies intrusive symptoms and is not interested in medication trial at this time -- Continue therapy with Doroteo Bradford  Patient was given contact information for behavioral health clinic and was instructed to call 911 for emergencies.   Subjective:  Chief Complaint: anxiety  History of Present Illness:   The patient reports having lived in [place] for the past 9 years.  She reports a long-term relationship with her boyfriend of 8 years.  She states that he pays for the apartment they live in and that they have a good relationship.  She reports having a 87-year-old child with this person.  She states that she is not breast-feeding.  She reports that she has additional social support in the area, her aunt and grandmother and that she can go to them for help.  She reports finishing high school and doing some college.  She denies using alcohol,  cigarettes, or illegal drugs.  The patient reports experiencing severe anxiety symptoms starting 3 years ago.  She reports experiencing panic attacks on a regular basis.  She reports these panic attacks consist of chest pain, shortness of breath, fear of dying, and diaphoresis.  She reports that she worries about many different issues.  She reports difficulties with fatigue, concentration, and occasional sleep problems.  Regarding depressive symptoms, the patient reports that her mood is fine most of the time.  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  She reports a weight loss of 40 pounds over the past year.  As stated above she says that her sleep is off and on.  She denies experiencing any suicidal thoughts,  Regarding her PTSD symptoms, the patient reports that she is working on this in therapy with Doroteo Bradford.  She states that she does not have significant intrusive symptoms.   Patient denies ever experiencing auditory or visual hallucinations.  She denies ever experiencing a manic episode.   Past Psychiatric History: as above  Previous Psychotropic Medications: Yes   Substance Abuse History in the last 12 months:  No.  Consequences of Substance Abuse: NA  Past Medical History:    Past Surgical History:  Procedure Laterality Date   diagnositic renal exam Bilateral    related to hypertension as adolescent    Family Psychiatric History: none  Family History:  Family History  Problem Relation Age of Onset   Depression Paternal Grandfather    Hypertension Mother    Depression Mother  Anxiety disorder Mother    Diabetes Brother    Williams syndrome Sister     Social History:   Social History   Socioeconomic History   Marital status: Significant Other    Spouse name: Not on file   Number of children: 0   Years of education: Not on file   Highest education level: Not on file  Occupational  History   Not on file  Tobacco Use   Smoking status: Never    Passive exposure: Never   Smokeless tobacco: Never  Vaping Use   Vaping Use: Never used  Substance and Sexual Activity   Alcohol use: No   Drug use: No   Sexual activity: Yes    Partners: Male    Birth control/protection: Pill  Other Topics Concern   Not on file  Social History Narrative   Not on file   Social Determinants of Health   Financial Resource Strain: Low Risk  (05/10/2022)   Overall Financial Resource Strain (CARDIA)    Difficulty of Paying Living Expenses: Not very hard  Food Insecurity: No Food Insecurity (05/10/2022)   Hunger Vital Sign    Worried About Running Out of Food in the Last Year: Never true    Ran Out of Food in the Last Year: Never true  Transportation Needs: No Transportation Needs (05/10/2022)   PRAPARE - Administrator, Civil Service (Medical): No    Lack of Transportation (Non-Medical): No  Physical Activity: Sufficiently Active (05/10/2022)   Exercise Vital Sign    Days of Exercise per Week: 6 days    Minutes of Exercise per Session: 60 min  Stress: Stress Concern Present (05/10/2022)   [employer] of Occupational Health - Occupational Stress Questionnaire    Feeling of Stress : Rather much  Social Connections: Socially Isolated (05/10/2022)   Social Connection and Isolation Panel [NHANES]    Frequency of Communication with Friends and Family: Twice a week    Frequency of Social Gatherings with Friends and Family: Never    Attends Religious Services: Never    Database administrator or Organizations: No    Attends Engineer, structural: Never    Marital Status: Living with partner    Additional Social History: none  Allergies:  No Known Allergies  Current Medications: Current Outpatient Medications  Medication Sig Dispense Refill   Drospirenone (SLYND) 4 MG TABS Take 1 tablet by mouth daily. 28 tablet 1   metroNIDAZOLE (FLAGYL) 500 MG tablet  Take 1 tablet (500 mg total) by mouth 2 (two) times daily. 14 tablet 0   Vitamin D, Ergocalciferol, (DRISDOL) 1.25 MG (50000 UNIT) CAPS capsule Take 1 capsule (50,000 Units total) by mouth every 7 (seven) days. Take for 12 total doses(weeks) then transition to Vitamin D3 2000-4000 iU a day. 12 capsule 0   No current facility-administered medications for this visit.    Objective:  Psychiatric Specialty Exam: Physical Exam Constitutional:      Appearance: the patient is not toxic-appearing.  Pulmonary:     Effort: Pulmonary effort is normal.  Neurological:     General: No focal deficit present.     Mental Status: the patient is alert and oriented to person, place, and time.   Review of Systems  Respiratory:  Negative for shortness of breath.   Cardiovascular:  Negative for chest pain.  Gastrointestinal:  Negative for abdominal pain, constipation, diarrhea, nausea and vomiting.  Neurological:  Negative for headaches.      There  were no vitals taken for this visit.  General Appearance: Fairly Groomed  Eye Contact:  Good  Speech:  Clear and Coherent  Volume:  Normal  Mood:  "fine"  Affect:  anxious  Thought Process:  Coherent  Orientation:  Full (Time, Place, and Person)  Thought Content: Logical   Suicidal Thoughts:  No  Homicidal Thoughts:  No  Memory:  Immediate;   Good  Judgement:  fair  Insight:  fair  Psychomotor Activity:  Normal  Concentration:  Concentration: Good  Recall:  Good  Fund of Knowledge: Good  Language: Good  Akathisia:  No  Handed:    AIMS (if indicated): not done  Assets:  Communication Skills Desire for Improvement Financial Resources/Insurance Housing Leisure Time Physical Health  ADL's:  Intact  Cognition: WNL  Sleep:  Fair     Metabolic Disorder Labs: No results found for: "HGBA1C", "MPG" No results found for: "PROLACTIN" No results found for: "CHOL", "TRIG", "HDL", "CHOLHDL", "VLDL", "LDLCALC" No results found for:  "TSH"  Therapeutic Level Labs: No results found for: "LITHIUM" No results found for: "CBMZ" No results found for: "VALPROATE"  Screenings:  GAD-7    Flowsheet Row Counselor from 05/10/2022 in [facility] from 08/02/2020 in [facility] Office Visit from 06/09/2019 in [facility]  Total GAD-7 Score 19 2 9       PHQ2-9    Flowsheet Row Counselor from 05/10/2022 in [facility] Office Visit from 03/23/2022 in [facility] from 08/02/2020 in [facility] [place]  PHQ-2 Total Score 5 4 1   PHQ-9 Total Score 21 9 --      Flowsheet Row ED from 12/12/2021 in [facility] Admission (Discharged) from 02/28/2021 in [facility] Admission (Discharged) from 01/26/2021 in [facility]  C-SSRS RISK CATEGORY No Risk No Risk No Risk       Collaboration of Care: Collaboration of Care: Other none  Patient/Guardian was advised Release of Information must be obtained prior to any record release in order to collaborate their care with an outside provider. Patient/Guardian was advised if they have not already done so to contact the registration department to sign all necessary forms in order for [place] to release information regarding their care.   Consent: Patient/Guardian gives verbal consent for treatment and assignment of benefits for services provided during this visit. Patient/Guardian expressed understanding and agreed to proceed.   A total of 60 minutes was spent involved in face to face clinical care, chart review, documentation.   Corky Sox, MD 11/16/20238:27 AM

## 2022-06-15 ENCOUNTER — Encounter (HOSPITAL_COMMUNITY): Payer: Self-pay | Admitting: Student

## 2022-06-27 ENCOUNTER — Ambulatory Visit (HOSPITAL_COMMUNITY): Payer: Medicaid Other | Admitting: Mental Health

## 2022-07-02 ENCOUNTER — Telehealth: Payer: Self-pay

## 2022-07-02 NOTE — Telephone Encounter (Signed)
LVM for patient to call us regarding a message she had left.

## 2022-07-05 ENCOUNTER — Encounter: Payer: Self-pay | Admitting: Obstetrics

## 2022-07-05 ENCOUNTER — Other Ambulatory Visit (HOSPITAL_COMMUNITY)
Admission: RE | Admit: 2022-07-05 | Discharge: 2022-07-05 | Disposition: A | Discharge status: Home / Self Care | Payer: Medicaid Other | Source: Ambulatory Visit | Attending: Obstetrics | Admitting: Obstetrics

## 2022-07-05 ENCOUNTER — Ambulatory Visit: Payer: Medicaid Other | Admitting: Obstetrics

## 2022-07-05 VITALS — BP 115/82 | HR 86 | Ht 65.0 in | Wt 123.0 lb

## 2022-07-05 DIAGNOSIS — Z01419 Encounter for gynecological examination (general) (routine) without abnormal findings: Secondary | ICD-10-CM | POA: Insufficient documentation

## 2022-07-05 DIAGNOSIS — N898 Other specified noninflammatory disorders of vagina: Secondary | ICD-10-CM | POA: Diagnosis not present

## 2022-07-05 DIAGNOSIS — Z3041 Encounter for surveillance of contraceptive pills: Secondary | ICD-10-CM | POA: Diagnosis not present

## 2022-07-05 MED ORDER — HYLAFEM VA SUPP: 1.0000 | Freq: Every day | VAGINAL | 0 refills | Status: DC

## 2022-07-05 MED ORDER — SLYND 4 MG PO TABS: 1.0000 | ORAL_TABLET | Freq: Every day | ORAL | 12 refills | Status: DC

## 2022-07-05 MED ORDER — FLUCONAZOLE 150 MG PO TABS: 150.0000 mg | ORAL_TABLET | Freq: Once | ORAL | 0 refills | Status: AC

## 2022-07-05 NOTE — Progress Notes (Signed)
26 y.o GYN presents for AEX/PAP.  C/o white vaginal discharge, odor, itching, burning.  Denies fever, chills, NV.

## 2022-07-05 NOTE — Progress Notes (Signed)
Subjective:        Anita Hoover is a 26 y.o. female here for a routine exam.  Current complaints: Vaginal discharge with itching.    Personal health questionnaire:  Is patient Ashkenazi Jewish, have a family history of breast and/or ovarian cancer: no Is there a family history of uterine cancer diagnosed at age < 32, gastrointestinal cancer, urinary tract cancer, family member who is a Field seismologist syndrome-associated carrier: no Is the patient overweight and hypertensive, family history of diabetes, personal history of gestational diabetes, preeclampsia or PCOS: no Is patient over 52, have PCOS,  family history of premature CHD under age 24, diabetes, smoke, have hypertension or peripheral artery disease:  no At any time, has a partner hit, kicked or otherwise hurt or frightened you?: no Over the past 2 weeks, have you felt down, depressed or hopeless?: no Over the past 2 weeks, have you felt little I   Gynecologic History Patient's last menstrual period was 06/15/2022. Contraception: OCP (estrogen/progesterone) Last Pap: 2022. Results were: normal Last mammogram: n/a. Results were: n/a  Obstetric History OB History  Gravida Para Term Preterm AB Living  1 1 1  0 0 1  SAB IAB Ectopic Multiple Live Births  0 0 0 0 1    # Outcome Date GA Lbr Len/2nd Weight Sex Delivery Anes PTL Lv  1 Term 03/03/21 [redacted]w[redacted]d 06:52 / 00:25 6 lb 10.9 oz (3.03 kg) F Vag-Spont EPI  LIV      Past Surgical History:  Procedure Laterality Date   diagnositic renal exam Bilateral    related to hypertension as adolescent     Current Outpatient Medications:    fluconazole (DIFLUCAN) 150 MG tablet, Take 1 tablet (150 mg total) by mouth once for 1 dose., Disp: 1 tablet, Rfl: 0   Homeopathic Products (HYLAFEM) SUPP, Place 1 suppository vaginally at bedtime., Disp: 3 suppository, Rfl: 0   Drospirenone (SLYND) 4 MG TABS, Take 1 tablet by mouth daily., Disp: 28 tablet, Rfl: 12   metroNIDAZOLE (FLAGYL) 500 MG tablet,  Take 1 tablet (500 mg total) by mouth 2 (two) times daily. (Patient not taking: Reported on 06/14/2022), Disp: 14 tablet, Rfl: 0   propranolol (INDERAL) 10 MG tablet, Take 1 tablet (10 mg total) by mouth 2 (two) times daily as needed (anxiety, panic)., Disp: 60 tablet, Rfl: 1   sertraline (ZOLOFT) 50 MG tablet, Take a half tablet (25 mg) for six days then take one tablet (50 mg) thereafter., Disp: 30 tablet, Rfl: 2   Vitamin D, Ergocalciferol, (DRISDOL) 1.25 MG (50000 UNIT) CAPS capsule, Take 1 capsule (50,000 Units total) by mouth every 7 (seven) days. Take for 12 total doses(weeks) then transition to Vitamin D3 2000-4000 iU a day., Disp: 12 capsule, Rfl: 0 No Known Allergies  Social History   Tobacco Use   Smoking status: Never    Passive exposure: Never   Smokeless tobacco: Never  Substance Use Topics   Alcohol use: Yes    Comment: rarely    Family History  Problem Relation Age of Onset   Depression Paternal Grandfather    Hypertension Mother    Depression Mother    Anxiety disorder Mother    Diabetes Brother    Williams syndrome Sister       Review of Systems  Constitutional: negative for fatigue and weight loss Respiratory: negative for cough and wheezing Cardiovascular: negative for chest pain, fatigue and palpitations Gastrointestinal: negative for abdominal pain and change in bowel habits Musculoskeletal:negative for myalgias Neurological:  negative for gait problems and tremors Behavioral/Psych: negative for abusive relationship, depression Endocrine: negative for temperature intolerance    Genitourinary: positive for vaginal discharge.  negative for abnormal menstrual periods, genital lesions, hot flashes, sexual problems  Integument/breast: negative for breast lump, breast tenderness, nipple discharge and skin lesion(s)    Objective:       BP 115/82   Pulse 86   Ht 5\' 5"  (1.651 m)   Wt 123 lb (55.8 kg)   LMP 06/15/2022   BMI 20.47 kg/m  General:   Alert and  no distress   Skin:   no rash or abnormalities  Lungs:   clear to auscultation bilaterally  Heart:   regular rate and rhythm, S1, S2 normal, no murmur, click, rub or gallop  Breasts:   normal without suspicious masses, skin or nipple changes or axillary nodes  Abdomen:  normal findings: no organomegaly, soft, non-tender and no hernia  Pelvis:  External genitalia: normal general appearance Urinary system: urethral meatus normal and bladder without fullness, nontender Vaginal: normal without tenderness, induration or masses Cervix: normal appearance Adnexa: normal bimanual exam Uterus: anteverted and non-tender, normal size   Lab Review Urine pregnancy test Labs reviewed yes Radiologic studies reviewed no  I have spent a total of 20 minutes of face-to-face and non-face-to-face time, excluding clinical staff time, reviewing notes and preparing to see patient, ordering tests and/or medications, and counseling the patient.   Assessment:    1. Encounter for gynecological examination with Papanicolaou smear of cervix Rx: - Cytology - PAP( [place])  2. Vaginal discharge Rx: - Cervicovaginal ancillary only( [place]) - fluconazole (DIFLUCAN) 150 MG tablet; Take 1 tablet (150 mg total) by mouth once for 1 dose.  Dispense: 1 tablet; Refill: 0 - Homeopathic Products (HYLAFEM) SUPP; Place 1 suppository vaginally at bedtime.  Dispense: 3 suppository; Refill: 0  3. Encounter for surveillance of contraceptive pills Rx: - Drospirenone (SLYND) 4 MG TABS; Take 1 tablet by mouth daily.  Dispense: 28 tablet; Refill: 12     Plan:    Education reviewed: calcium supplements, depression evaluation, low fat, low cholesterol diet, safe sex/STD prevention, self breast exams, and weight bearing exercise. Contraception: oral progesterone-only contraceptive. Follow up in: 1 year.   Meds ordered this encounter  Medications   Drospirenone (SLYND) 4 MG TABS    Sig: Take 1 tablet by mouth daily.     Dispense:  28 tablet    Refill:  12   fluconazole (DIFLUCAN) 150 MG tablet    Sig: Take 1 tablet (150 mg total) by mouth once for 1 dose.    Dispense:  1 tablet    Refill:  0   Homeopathic Products (HYLAFEM) SUPP    Sig: Place 1 suppository vaginally at bedtime.    Dispense:  3 suppository    Refill:  0     06/17/2022, MD 07/05/2022 12:19 PM

## 2022-07-06 ENCOUNTER — Other Ambulatory Visit: Payer: Self-pay | Admitting: Obstetrics

## 2022-07-06 ENCOUNTER — Other Ambulatory Visit (HOSPITAL_COMMUNITY): Payer: Self-pay | Admitting: Student

## 2022-07-06 DIAGNOSIS — F4310 Post-traumatic stress disorder, unspecified: Secondary | ICD-10-CM

## 2022-07-06 DIAGNOSIS — F411 Generalized anxiety disorder: Secondary | ICD-10-CM

## 2022-07-06 LAB — CERVICOVAGINAL ANCILLARY ONLY
Bacterial Vaginitis (gardnerella): POSITIVE — AB
Candida Glabrata: NEGATIVE
Candida Vaginitis: NEGATIVE
Chlamydia: NEGATIVE
Comment: NEGATIVE
Comment: NEGATIVE
Comment: NEGATIVE
Comment: NEGATIVE
Comment: NEGATIVE
Comment: NORMAL
Neisseria Gonorrhea: NEGATIVE
Trichomonas: NEGATIVE

## 2022-07-09 ENCOUNTER — Telehealth (HOSPITAL_COMMUNITY): Payer: Self-pay | Admitting: Mental Health

## 2022-07-09 ENCOUNTER — Ambulatory Visit (HOSPITAL_COMMUNITY): Payer: Medicaid Other | Admitting: Mental Health

## 2022-07-09 ENCOUNTER — Encounter (HOSPITAL_COMMUNITY): Payer: Self-pay

## 2022-07-09 NOTE — Telephone Encounter (Signed)
Therapist sent link for OP tele-therapy appointment. No response after x 10 minutes. Contacted pt via telephone; no answer. Left message to reschedule.

## 2022-07-11 LAB — CYTOLOGY - PAP: Diagnosis: NEGATIVE

## 2022-07-12 ENCOUNTER — Ambulatory Visit: Payer: Medicaid Other | Admitting: Obstetrics and Gynecology

## 2022-07-19 ENCOUNTER — Ambulatory Visit (INDEPENDENT_AMBULATORY_CARE_PROVIDER_SITE_OTHER): Payer: Medicaid Other | Admitting: Student

## 2022-07-19 ENCOUNTER — Encounter (HOSPITAL_COMMUNITY): Payer: Self-pay | Admitting: Student

## 2022-07-19 VITALS — BP 119/87 | HR 69 | Ht 65.0 in | Wt 133.0 lb

## 2022-07-19 DIAGNOSIS — F411 Generalized anxiety disorder: Secondary | ICD-10-CM | POA: Diagnosis not present

## 2022-07-19 DIAGNOSIS — F4310 Post-traumatic stress disorder, unspecified: Secondary | ICD-10-CM

## 2022-07-19 MED ORDER — DULOXETINE HCL 30 MG PO CPEP: 30.0000 mg | ORAL_CAPSULE | Freq: Every day | ORAL | 2 refills | Status: DC

## 2022-07-19 MED ORDER — PROPRANOLOL HCL 10 MG PO TABS: 10.0000 mg | ORAL_TABLET | Freq: Two times a day (BID) | ORAL | 1 refills | Status: DC | PRN

## 2022-07-19 NOTE — Progress Notes (Signed)
BH MD Outpatient Progress Note  07/19/2022 1:56 PM Anita Hoover  MRN:  [id number]  Assessment:  Anita Hoover presents for follow-up evaluation in-person. Today, 07/19/22, patient reports a decrease in her anxiety and panic attacks after starting Zoloft.  Unfortunately, she developed decreased appetite with the medication and wishes to try another antidepressant.  Identifying Information: Anita Hoover is a 26 y.o. y.o. female with a history of Generalized Anxiety Disorder and PTSD who is an established patient with [facility] for management of anxiety.    Plan:   # GAD Past medication trials: Prozac and Zoloft, unsure if either was effective. Tried Zoloft for 4 weeks in November-December with good  effect on anxiety but caused intolerable decrease in appetite.  Status of problem: improving Interventions: -- Stop Zoloft (patient has not taken in four days) -- Start Cymbalta 30 mg daily for anxiety -- Continue Propranolol 10 mg BID PRN for anxiety and panic attacks -- RTC 4 weeks -- Patient to reschedule therapy   # PTSD Past medication trials: none Status of problem: active Interventions: Denies intrusive symptoms and is not interested in medication trial at this time -- Continue therapy with Anita Hoover   Patient was given contact information for behavioral health clinic and was instructed to call 911 for emergencies.     Subjective:  Chief Complaint:  Chief Complaint  Patient presents with   Anxiety    Interval History:  Patient reports a harmonious family and living situation.  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  She reports that her 73-year-old daughter, Anita Hoover, is doing well and that she has a good relationship with her boyfriend.  She reports a large reduction in her anxiety and panic attack symptoms quickly after starting the Zoloft.  Unfortunately, she developed significant reduction in her appetite and  experienced mild drowsiness.  She wishes to continue taking an antidepressant to help with her anxiety but wants a new medication.  She reports that her mood has been good since her last visit and that she feels happy on a regular basis.  She reports no particular benefit from the propranolol, which she is taking 1-2 times daily.  She denies experiencing any side effects and wishes to continue this medication to see if that is helpful in the future.  He denies experiencing any depression or suicidal thoughts.  There are no homicidal thoughts or auditory or visual hallucinations.  Visit Diagnosis:    ICD-10-CM   1. GAD (generalized anxiety disorder)  F41.1 DULoxetine (CYMBALTA) 30 MG capsule    2. Post traumatic stress disorder  F43.10 DULoxetine (CYMBALTA) 30 MG capsule      Past Psychiatric History: GAD, PTSD  Past Medical History:    Past Surgical History:  Procedure Laterality Date   diagnositic renal exam Bilateral    related to hypertension as adolescent    Family Psychiatric History: none  Family History:  Family History  Problem Relation Age of Onset   Depression Paternal Grandfather    Hypertension Mother    Depression Mother    Anxiety disorder Mother    Diabetes Brother    Williams syndrome Sister     Social History:  Social History   Socioeconomic History   Marital status: Significant Other    Spouse name: Not on file   Number of children: 0   Years of education: Not on file   Highest education level: Not on file  Occupational History  Not on file  Tobacco Use   Smoking status: Never    Passive exposure: Never   Smokeless tobacco: Never  Vaping Use   Vaping Use: Never used  Substance and Sexual Activity   Alcohol use: Yes    Comment: rarely   Drug use: No   Sexual activity: Yes    Partners: Male    Birth control/protection: Pill, OCP  Other Topics Concern   Not on file  Social History Narrative   Not on file   Social Determinants of Health    Financial Resource Strain: Low Risk  (05/10/2022)   Overall Financial Resource Strain (CARDIA)    Difficulty of Paying Living Expenses: Not very hard  Food Insecurity: No Food Insecurity (05/10/2022)   Hunger Vital Sign    Worried About Running Out of Food in the Last Year: Never true    Ran Out of Food in the Last Year: Never true  Transportation Needs: No Transportation Needs (05/10/2022)   PRAPARE - Administrator, Civil Service (Medical): No    Lack of Transportation (Non-Medical): No  Physical Activity: Sufficiently Active (05/10/2022)   Exercise Vital Sign    Days of Exercise per Week: 6 days    Minutes of Exercise per Session: 60 min  Stress: Stress Concern Present (05/10/2022)   Anita [facility] [employer] - Occupational Stress Questionnaire    Feeling of Stress : Rather much  Social Connections: Socially Isolated (05/10/2022)   Social Connection and Isolation Panel [NHANES]    Frequency of Communication with Friends and Family: Twice a week    Frequency of Social Gatherings with Friends and Family: Never    Attends Religious Services: Never    Database administrator or Organizations: No    Attends Engineer, structural: Never    Marital Status: Living with partner    Allergies: No Known Allergies  Current Medications: Current Outpatient Medications  Medication Sig Dispense Refill   DULoxetine (CYMBALTA) 30 MG capsule Take 1 capsule (30 mg total) by mouth daily. 30 capsule 2   propranolol (INDERAL) 10 MG tablet Take 1 tablet (10 mg total) by mouth 2 (two) times daily as needed (anxiety, panic). 60 tablet 1   Drospirenone (SLYND) 4 MG TABS Take 1 tablet by mouth daily. 28 tablet 12   Homeopathic Products (HYLAFEM) SUPP Place 1 suppository vaginally at bedtime. 3 suppository 0   metroNIDAZOLE (FLAGYL) 500 MG tablet Take 1 tablet (500 mg total) by mouth 2 (two) times daily. (Patient not taking: Reported on 06/14/2022) 14 tablet 0    Vitamin D, Ergocalciferol, (DRISDOL) 1.25 MG (50000 UNIT) CAPS capsule Take 1 capsule (50,000 Units total) by mouth every 7 (seven) days. Take for 12 total doses(weeks) then transition to Vitamin D3 2000-4000 iU a day. 12 capsule 0   No current facility-administered medications for this visit.     Objective:  Psychiatric Specialty Exam: Physical Exam Constitutional:      Appearance: the patient is not toxic-appearing.  Pulmonary:     Effort: Pulmonary effort is normal.  Neurological:     General: No focal deficit present.     Mental Status: the patient is alert and oriented to person, place, and time.   Review of Systems  Respiratory:  Negative for shortness of breath.   Cardiovascular:  Negative for chest pain.  Gastrointestinal:  Negative for abdominal pain, constipation, diarrhea, nausea and vomiting.  Neurological:  Negative for headaches.      BP 119/87  Pulse 69   Ht 5\' 5"  (1.651 m)   Wt 133 lb (60.3 kg)   LMP 06/15/2022   BMI 22.13 kg/m   General Appearance: Fairly Groomed  Eye Contact:  Good  Speech:  Clear and Coherent  Volume:  Normal  Mood:  Euthymic  Affect:  Congruent  Thought Process:  Coherent  Orientation:  Full (Time, Place, and Person)  Thought Content: Logical   Suicidal Thoughts:  No  Homicidal Thoughts:  No  Memory:  Immediate;   Good  Judgement:  fair  Insight:  fair  Psychomotor Activity:  Normal  Concentration:  Concentration: Good  Recall:  Good  Fund of Knowledge: Good  Language: Good  Akathisia:  No  Handed:    AIMS (if indicated): not done  Assets:  Communication Skills Desire for Improvement Financial Resources/Insurance Housing Leisure Time Physical Health  ADL's:  Intact  Cognition: WNL  Sleep:  Fair     Metabolic Disorder Labs: No results found for: "HGBA1C", "MPG" No results found for: "PROLACTIN" No results found for: "CHOL", "TRIG", "HDL", "CHOLHDL", "VLDL", "LDLCALC" No results found for: "TSH"  Therapeutic  Level Labs: No results found for: "LITHIUM" No results found for: "VALPROATE" No results found for: "CBMZ"  Screenings: GAD-7    Flowsheet Row Counselor from 05/10/2022 in [facility] Clinical Support from 08/02/2020 in [facility] Office Visit from 06/09/2019 in [facility]  Total GAD-7 Score 19 2 9       PHQ2-9    Flowsheet Row Office Visit from 07/05/2022 in [facility] Counselor from 05/10/2022 in [facility] Office Visit from 03/23/2022 in [facility] Clinical Support from 08/02/2020 in [facility]  PHQ-2 Total Score 3 5 4 1   PHQ-9 Total Score -- 21 9 --      Flowsheet Row ED from 12/12/2021 in [facility] Admission (Discharged) from 02/28/2021 in [place] 1S 12/14/2021 Specialty Care Admission (Discharged) from 01/26/2021 in [facility] 1S Maternity Assessment Unit  C-SSRS RISK CATEGORY No Risk No Risk No Risk       Collaboration of Care: Collaboration of Care: none  Patient/Guardian was advised Release of Information must be obtained prior to any record release in order to collaborate their care with an outside provider. Patient/Guardian was advised if they have not already done so to contact the registration department to sign all necessary forms in order for [place] to release information regarding their care.   Consent: Patient/Guardian gives verbal consent for treatment and assignment of benefits for services provided during this visit. Patient/Guardian expressed understanding and agreed to proceed.   A total of 30 minutes was spent involved in face to face clinical care, chart review, documentation.   01/28/2021, MD 07/19/2022, 1:56 PM

## 2022-08-15 ENCOUNTER — Other Ambulatory Visit: Payer: Self-pay | Admitting: *Deleted

## 2022-08-15 DIAGNOSIS — N898 Other specified noninflammatory disorders of vagina: Secondary | ICD-10-CM

## 2022-08-15 MED ORDER — FLUCONAZOLE 150 MG PO TABS: 150.0000 mg | ORAL_TABLET | Freq: Once | ORAL | 0 refills | Status: AC

## 2022-08-15 NOTE — Progress Notes (Signed)
TC from pt reporting vaginal discharge and itching. RX Diflucan per protocol.

## 2022-08-20 ENCOUNTER — Ambulatory Visit (INDEPENDENT_AMBULATORY_CARE_PROVIDER_SITE_OTHER): Payer: Medicaid Other | Admitting: Mental Health

## 2022-08-20 DIAGNOSIS — F322 Major depressive disorder, single episode, severe without psychotic features: Secondary | ICD-10-CM | POA: Diagnosis not present

## 2022-08-20 DIAGNOSIS — F411 Generalized anxiety disorder: Secondary | ICD-10-CM

## 2022-08-20 DIAGNOSIS — F4310 Post-traumatic stress disorder, unspecified: Secondary | ICD-10-CM
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
## 2022-08-23 ENCOUNTER — Encounter (HOSPITAL_COMMUNITY): Payer: Medicaid Other | Admitting: Student

## 2022-09-04 ENCOUNTER — Ambulatory Visit: Payer: Medicaid Other | Admitting: Obstetrics & Gynecology

## 2022-09-07 ENCOUNTER — Other Ambulatory Visit: Payer: Self-pay

## 2022-09-07 DIAGNOSIS — B379 Candidiasis, unspecified: Secondary | ICD-10-CM

## 2022-09-07 MED ORDER — FLUCONAZOLE 150 MG PO TABS: 150.0000 mg | ORAL_TABLET | Freq: Once | ORAL | 0 refills | Status: AC

## 2022-09-13 ENCOUNTER — Encounter (HOSPITAL_COMMUNITY): Payer: Self-pay

## 2022-09-13 ENCOUNTER — Ambulatory Visit (HOSPITAL_COMMUNITY): Payer: Medicaid Other | Admitting: Mental Health

## 2022-10-02 ENCOUNTER — Telehealth: Payer: Self-pay

## 2022-10-02 DIAGNOSIS — B379 Candidiasis, unspecified: Secondary | ICD-10-CM

## 2022-10-02 MED ORDER — FLUCONAZOLE 150 MG PO TABS: 150.0000 mg | ORAL_TABLET | Freq: Once | ORAL | 0 refills | Status: AC

## 2022-10-02 NOTE — Telephone Encounter (Signed)
Patient called and left message requesting a rx for diflucan. Patient states that she is having vaginal itching and discharge x 4 days. Diflucan sent to the pharmacy per protocol.

## 2022-10-03 ENCOUNTER — Other Ambulatory Visit: Payer: Self-pay | Admitting: Obstetrics

## 2022-10-03 ENCOUNTER — Other Ambulatory Visit: Payer: Self-pay

## 2022-10-03 DIAGNOSIS — B379 Candidiasis, unspecified: Secondary | ICD-10-CM

## 2022-10-03 MED ORDER — TERCONAZOLE 0.4 % VA CREA: 1.0000 | TOPICAL_CREAM | Freq: Every day | VAGINAL | 0 refills | Status: DC

## 2022-10-04 ENCOUNTER — Other Ambulatory Visit: Payer: Self-pay

## 2022-10-04 DIAGNOSIS — B379 Candidiasis, unspecified: Secondary | ICD-10-CM

## 2022-10-04 MED ORDER — FLUCONAZOLE 150 MG PO TABS: 150.0000 mg | ORAL_TABLET | Freq: Once | ORAL | 0 refills | Status: AC

## 2022-11-08 ENCOUNTER — Other Ambulatory Visit: Payer: Self-pay | Admitting: *Deleted

## 2022-11-08 DIAGNOSIS — N898 Other specified noninflammatory disorders of vagina: Secondary | ICD-10-CM

## 2022-11-08 MED ORDER — FLUCONAZOLE 150 MG PO TABS: 150.0000 mg | ORAL_TABLET | Freq: Once | ORAL | 0 refills | Status: AC

## 2022-11-08 NOTE — Progress Notes (Signed)
TC from pt reporting signs and symptoms of vaginal yeast infection. RX Diflucan per protocol. This is 3rd yeast infection in 4 months. Pt previously discussed boric acid regimen with Dr. Clearance Coots but med not sent. Msg sent to Dr. Clearance Coots for follow up.

## 2022-11-09 ENCOUNTER — Other Ambulatory Visit: Payer: Self-pay | Admitting: *Deleted

## 2022-11-09 MED ORDER — BORIC ACID CRYS: 600.0000 mg | CRYSTALS | Freq: Every day | 5 refills | Status: DC

## 2022-11-09 NOTE — Progress Notes (Signed)
RX boric acid per Dr. Clearance Coots for recurrent yeast infection.

## 2022-11-20 ENCOUNTER — Ambulatory Visit (HOSPITAL_BASED_OUTPATIENT_CLINIC_OR_DEPARTMENT_OTHER): Payer: Medicaid Other | Admitting: Family Medicine

## 2022-11-20 ENCOUNTER — Other Ambulatory Visit (HOSPITAL_BASED_OUTPATIENT_CLINIC_OR_DEPARTMENT_OTHER): Payer: Self-pay | Admitting: Family Medicine

## 2022-11-20 ENCOUNTER — Encounter (HOSPITAL_BASED_OUTPATIENT_CLINIC_OR_DEPARTMENT_OTHER): Payer: Self-pay | Admitting: Family Medicine

## 2022-11-20 VITALS — BP 126/99 | HR 106 | Temp 97.6°F | Ht 65.0 in | Wt 139.0 lb

## 2022-11-20 DIAGNOSIS — R140 Abdominal distension (gaseous): Secondary | ICD-10-CM | POA: Diagnosis not present

## 2022-11-20 LAB — CBC WITH DIFFERENTIAL/PLATELET
Basophils Absolute: 0 10*3/uL (ref 0.0–0.2)
Basos: 0 %
EOS (ABSOLUTE): 0.2 10*3/uL (ref 0.0–0.4)
Eos: 3 %
Hematocrit: 41.9 % (ref 34.0–46.6)
Hemoglobin: 13.8 g/dL (ref 11.1–15.9)
Immature Grans (Abs): 0 10*3/uL (ref 0.0–0.1)
Immature Granulocytes: 0 %
Lymphocytes Absolute: 2.5 10*3/uL (ref 0.7–3.1)
Lymphs: 35 %
MCH: 30.3 pg (ref 26.6–33.0)
MCHC: 32.9 g/dL (ref 31.5–35.7)
MCV: 92 fL (ref 79–97)
Monocytes Absolute: 0.6 10*3/uL (ref 0.1–0.9)
Monocytes: 8 %
Neutrophils Absolute: 4 10*3/uL (ref 1.4–7.0)
Neutrophils: 54 %
Platelets: 324 10*3/uL (ref 150–450)
RBC: 4.55 x10E6/uL (ref 3.77–5.28)
RDW: 12.1 % (ref 11.7–15.4)
WBC: 7.3 10*3/uL (ref 3.4–10.8)

## 2022-11-20 LAB — POCT URINE PREGNANCY: Preg Test, Ur: NEGATIVE

## 2022-11-20 MED ORDER — ONDANSETRON HCL 4 MG PO TABS: 4.0000 mg | ORAL_TABLET | Freq: Three times a day (TID) | ORAL | 0 refills | Status: DC | PRN

## 2022-11-20 NOTE — Progress Notes (Signed)
Acute Office Visit  Subjective:     Patient ID: Anita Hoover, female    DOB: 27-Apr-1996, 27 y.o.   MRN: [id number]  Chief Complaint  Patient presents with   Bloated    Pt here for feeling bloating all the time, she stated would be vomiting and nausea, no energy at all, she stated this started about one month ago    Fatigue   Anita Hoover is a 27 yo female patient who presents today for abdominal bloating and fatigue. She reports this has been ongoing for the past month. She reports feeling fuller faster, abdominal bloating, and having loose BMs. She reports that this is not normal for her- bowel movements are usually more formed. Her bowel movements have not changed since this diarrhea started almost a month ago. She also reports feeling more fatigued. She reports nausea and vomiting. If she has not eaten a meal, she usually vomits bile. Denies abdominal pain or cramping, reports that it feels like discomfort. Does not feel relief of bloating with passing of BM or gas. She does not think it correlates to what she eats. She thought it was new vitamins she tried, but she stopped taking them and continued to have symptoms. She took a home pregnancy test that was negative. She is currently taking Slynd as prescribed. Denies weight loss, blood in stool, fever/chills, palpitations.    Review of Systems  Constitutional:  Negative for chills, fever, malaise/fatigue and weight loss.  Eyes:  Negative for blurred vision and double vision.  Respiratory:  Negative for cough and shortness of breath.   Cardiovascular:  Negative for chest pain.  Gastrointestinal:  Positive for diarrhea, nausea and vomiting. Negative for abdominal pain, blood in stool and heartburn.  Musculoskeletal:  Negative for myalgias.  Neurological:  Negative for dizziness and headaches.       Objective:  BP (!) 126/99 (BP Location: Right Arm, Patient Position: Sitting, Cuff Size: Normal)   Pulse (!) 106   Temp 97.6 F (36.4  C) (Oral)   Ht  (1.651 m)   Wt 139 lb (63 kg)   SpO2 96%   BMI 23.13 kg/m   Physical Exam Constitutional:      Appearance: Normal appearance.  Cardiovascular:     Rate and Rhythm: Normal rate and regular rhythm.     Pulses: Normal pulses.     Heart sounds: Normal heart sounds.  Pulmonary:     Effort: Pulmonary effort is normal.     Breath sounds: Normal breath sounds.  Abdominal:     General: Abdomen is flat. Bowel sounds are normal.     Palpations: Abdomen is soft.     Tenderness: There is no abdominal tenderness. There is no guarding or rebound.  Neurological:     Mental Status: She is alert.  Psychiatric:        Mood and Affect: Mood normal.        Behavior: Behavior normal.        Thought Content: Thought content normal.        Judgment: Judgment normal.    Assessment & Plan:  1. Abdominal bloating Patient well-appearing and in no acute distress. Cardiovascular exam with heart regular rate and rhythm. Normal heart sounds, no murmurs present. Lungs clear to auscultation bilaterally. Abdominal exam soft, non-tender with normal bowel sounds. POCT urine pregnancy test negative. Will obtain CBC today to assess for any electrolyte abnormalities and hydration status. Discussed options, including stool sample to test for specific bacteria,  conservative management, consumption of high fiber foods, and possible referral to GI. Will obtain labs and discuss further management with patient.  - CBC with Differential/Platelet - POCT urine pregnancy   Return in about 6 weeks (around 01/01/2023) for abdominal bloating .  Alyson Reedy, FNP

## 2022-11-26 NOTE — Addendum Note (Signed)
Addended by: Katharine Look on: 11/26/2022 08:54 AM   Modules accepted: Orders

## 2022-12-06 ENCOUNTER — Other Ambulatory Visit: Payer: Self-pay | Admitting: *Deleted

## 2022-12-06 MED ORDER — FLUCONAZOLE 200 MG PO TABS: 200.0000 mg | ORAL_TABLET | ORAL | 2 refills | Status: DC

## 2022-12-06 NOTE — Progress Notes (Signed)
TC from pt reporting signs and symptoms of yeast infection. Pt has been experiencing recurrent yeast infections. Previous RX Boric Acid regimen, but pt did not pick up because RX not covered by insurance. I consulted with Dr. Clearance Coots. RX Diflucan 200 mg daily, every 3 days, with 2 refills.

## 2022-12-18 ENCOUNTER — Encounter: Payer: Self-pay | Admitting: Obstetrics

## 2022-12-18 ENCOUNTER — Other Ambulatory Visit (HOSPITAL_COMMUNITY)
Admission: RE | Admit: 2022-12-18 | Discharge: 2022-12-18 | Disposition: A | Discharge status: Home / Self Care | Payer: Medicaid Other | Source: Ambulatory Visit | Attending: Obstetrics | Admitting: Obstetrics

## 2022-12-18 ENCOUNTER — Ambulatory Visit (INDEPENDENT_AMBULATORY_CARE_PROVIDER_SITE_OTHER): Payer: Medicaid Other | Admitting: Obstetrics

## 2022-12-18 VITALS — HR 94 | Ht 65.5 in | Wt 139.6 lb

## 2022-12-18 DIAGNOSIS — N760 Acute vaginitis: Secondary | ICD-10-CM

## 2022-12-18 DIAGNOSIS — I10 Essential (primary) hypertension: Secondary | ICD-10-CM | POA: Diagnosis not present

## 2022-12-18 DIAGNOSIS — B3731 Acute candidiasis of vulva and vagina: Secondary | ICD-10-CM | POA: Insufficient documentation

## 2022-12-18 DIAGNOSIS — B9689 Other specified bacterial agents as the cause of diseases classified elsewhere: Secondary | ICD-10-CM

## 2022-12-18 NOTE — Progress Notes (Signed)
Pt presents to discuss recurrent yeast infections and BV infections.

## 2022-12-18 NOTE — Progress Notes (Signed)
Patient ID: Anita Hoover, female   DOB: 1996/02/10, 27 y.o.   MRN: [id number]  HPI Anita Hoover is a 27 y.o. female.  Complains of chronic BV and yeast infections. HPI    Past Surgical History:  Procedure Laterality Date   diagnositic renal exam Bilateral    related to hypertension as adolescent    Family History  Problem Relation Age of Onset   Depression Paternal Grandfather    Hypertension Mother    Depression Mother    Anxiety disorder Mother    Diabetes Brother    Williams syndrome Sister     Social History Social History   Tobacco Use   Smoking status: Never    Passive exposure: Never   Smokeless tobacco: Never  Vaping Use   Vaping Use: Never used  Substance Use Topics   Alcohol use: Yes    Comment: rarely   Drug use: Yes    Types: Marijuana    No Known Allergies  Current Outpatient Medications  Medication Sig Dispense Refill   Boric Acid Vaginal (AZO BORIC ACID) 600 MG SUPP Place 1 suppository vaginally at bedtime. 14 suppository 5   Drospirenone (SLYND) 4 MG TABS Take 1 tablet by mouth daily. 28 tablet 12   No current facility-administered medications for this visit.    Review of Systems Review of Systems Constitutional: negative for fatigue and weight loss Respiratory: negative for cough and wheezing Cardiovascular: negative for chest pain, fatigue and palpitations Gastrointestinal: negative for abdominal pain and change in bowel habits Genitourinary: positive for recurrent BV and yeast infections Integument/breast: negative for nipple discharge Musculoskeletal:negative for myalgias Neurological: negative for gait problems and tremors Behavioral/Psych: negative for abusive relationship, depression Endocrine: negative for temperature intolerance      Pulse 94, height 5' 5.5" (1.664 m), weight 139 lb 9.6 oz (63.3 kg), last menstrual period 12/18/2022, currently breastfeeding.  Physical Exam Physical Exam General:   Alert and no distress   Skin:   no rash or abnormalities  Lungs:   clear to auscultation bilaterally  Heart:   regular rate and rhythm, S1, S2 normal, no murmur, click, rub or gallop  Breasts:   normal without suspicious masses, skin or nipple changes or axillary nodes  Abdomen:  normal findings: no organomegaly, soft, non-tender and no hernia  Pelvis:  External genitalia: normal general appearance Urinary system: urethral meatus normal and bladder without fullness, nontender Vaginal: normal without tenderness, induration or masses Cervix: normal appearance Adnexa: normal bimanual exam Uterus: anteverted and non-tender, normal size    I have spent a total of 20 minutes of face-to-face time, excluding clinical staff time, reviewing notes and preparing to see patient, ordering tests and/or medications, and counseling the patient.   Data Reviewed Wet Prep  Assessment     1. BV (bacterial vaginosis), chronic - Boric Acid vaginal suppositories recommended monthly for 6 months Rx: - Cervicovaginal ancillary only( [facility])  2. Candida vaginitis, chronic - same as above Rx: - Cervicovaginal ancillary only( [facility])     Plan  Follow up in 6 months    Meds ordered this encounter  Medications   Boric Acid Vaginal (AZO BORIC ACID) 600 MG SUPP    Sig: Place 1 suppository vaginally at bedtime.    Dispense:  14 suppository    Refill:  5    Brock Bad, MD 01/03/2023 12:24 PM

## 2022-12-19 ENCOUNTER — Encounter: Payer: Self-pay | Admitting: Obstetrics

## 2022-12-19 LAB — CERVICOVAGINAL ANCILLARY ONLY
Bacterial Vaginitis (gardnerella): NEGATIVE
Candida Glabrata: NEGATIVE
Candida Vaginitis: NEGATIVE
Chlamydia: NEGATIVE
Comment: NEGATIVE
Comment: NEGATIVE
Comment: NEGATIVE
Comment: NEGATIVE
Comment: NEGATIVE
Comment: NORMAL
Neisseria Gonorrhea: NEGATIVE
Trichomonas: NEGATIVE

## 2022-12-19 MED ORDER — AZO BORIC ACID 600 MG VA SUPP: 1.0000 | Freq: Every day | VAGINAL | 5 refills | Status: DC

## 2022-12-25 ENCOUNTER — Other Ambulatory Visit (HOSPITAL_BASED_OUTPATIENT_CLINIC_OR_DEPARTMENT_OTHER): Payer: Self-pay | Admitting: Family Medicine

## 2022-12-27 ENCOUNTER — Other Ambulatory Visit (HOSPITAL_BASED_OUTPATIENT_CLINIC_OR_DEPARTMENT_OTHER): Payer: Self-pay | Admitting: Family Medicine

## 2023-02-14 ENCOUNTER — Ambulatory Visit (INDEPENDENT_AMBULATORY_CARE_PROVIDER_SITE_OTHER): Payer: Medicaid Other | Admitting: Student

## 2023-02-14 ENCOUNTER — Encounter: Payer: Self-pay | Admitting: Student

## 2023-02-14 VITALS — BP 122/81 | HR 67 | Ht 65.5 in | Wt 136.6 lb

## 2023-02-14 DIAGNOSIS — N6452 Nipple discharge: Secondary | ICD-10-CM

## 2023-02-14 LAB — POCT URINE PREGNANCY: Preg Test, Ur: NEGATIVE

## 2023-02-14 NOTE — Progress Notes (Signed)
Pt reports white, milky discharge for 5 days. Declines pain and lumps.

## 2023-02-14 NOTE — Progress Notes (Signed)
  History:  [name] is a 27 y.o. G1P1001 who presents to clinic today for new onset of milky breast discharge for 5 days. Patient states she is able to express the milk via massaging and has noticed some leaking on its on. Patient stopped breastfeeding > 1 year ago. Patient is unsure of her last LMP and thinks it was in April. Reports having unprotected sexual intercourse since her last period. Took an at home pregnancy test that was negative. Patient is concerned for pregnancy because of intermittent back aches and what feels like "flutters" in her belly.   The following portions of the patient's history were reviewed and updated as appropriate: allergies, current medications, family history, past medical history, social history, past surgical history and problem list.  Review of Systems:  Review of Systems  All other systems reviewed and are negative.     Objective:  Physical Exam BP 122/81   Pulse 67   Ht 5' 5.5" (1.664 m)   Wt 136 lb 9.6 oz (62 kg)   BMI 22.39 kg/m  Physical Exam Vitals reviewed. Exam conducted with a chaperone present.  Constitutional:      Appearance: Normal appearance.  Cardiovascular:     Rate and Rhythm: Normal rate.  Pulmonary:     Effort: Pulmonary effort is normal.  Chest:     Chest wall: No mass, deformity or tenderness.  Breasts:    Breasts are symmetrical.     Right: Nipple discharge present. No inverted nipple, mass, skin change or tenderness.     Left: Nipple discharge present. No inverted nipple, mass, skin change or tenderness.     Comments: White, milky discharge that is easily expressible in droplet amounts Abdominal:     General: Abdomen is flat.     Palpations: Abdomen is soft.  Musculoskeletal:        General: Normal range of motion.  Skin:    General: Skin is warm and dry.  Neurological:     Mental Status: She is alert and oriented to person, place, and time. Mental status is at baseline.       Labs and Imaging No  results found for this or any previous visit (from the past 24 hour(s)).  No results found.  Health Maintenance Due  Topic Date Due   HPV VACCINES (1 - 3-dose series) Never done   COVID-19 Vaccine (1 - 2023-24 season) Never done    Labs, imaging and previous visits in Epic and Care Everywhere reviewed  Assessment & Plan:  1. Breast discharge - Some reassurance provided in setting of bilateral presentation. Presentation seems consistent with physiological discharge, but counseled to pursue ultrasound to ensure their is no pathophysiological cause.  - POCT urine pregnancy - Prolactin - TSH Rfx on Abnormal to Free T4 - Renal Function Panel - [place] LIMITED ULTRASOUND INCLUDING AXILLA LEFT BREAST ; Future - [place] LIMITED ULTRASOUND INCLUDING AXILLA RIGHT BREAST; Future - Beta hCG quant (ref lab)  Approximately 25 minutes of total time was spent with this patient on exam and coordination of care.  No follow-ups on file. Will plan follow-up based on indicated management   Corlis Hove, NP 02/14/2023 11:52 AM

## 2023-02-15 LAB — RENAL FUNCTION PANEL
Albumin: 4.5 g/dL (ref 4.0–5.0)
BUN/Creatinine Ratio: 13 (ref 9–23)
BUN: 11 mg/dL (ref 6–20)
CO2: 20 mmol/L (ref 20–29)
Calcium: 9.8 mg/dL (ref 8.7–10.2)
Chloride: 103 mmol/L (ref 96–106)
Creatinine, Ser: 0.83 mg/dL (ref 0.57–1.00)
Glucose: 98 mg/dL (ref 70–99)
Phosphorus: 3.5 mg/dL (ref 3.0–4.3)
Potassium: 4.3 mmol/L (ref 3.5–5.2)
Sodium: 138 mmol/L (ref 134–144)
eGFR: 101 mL/min/{1.73_m2} (ref >59)

## 2023-02-15 LAB — PROLACTIN: Prolactin: 8.3 ng/mL (ref 4.8–33.4)

## 2023-02-15 LAB — BETA HCG QUANT (REF LAB): hCG Quant: <1 m[IU]/mL

## 2023-02-15 LAB — TSH RFX ON ABNORMAL TO FREE T4: TSH: 1.45 u[IU]/mL (ref 0.450–4.500)

## 2023-03-07 ENCOUNTER — Encounter (HOSPITAL_COMMUNITY): Payer: Self-pay

## 2023-03-07 ENCOUNTER — Ambulatory Visit (INDEPENDENT_AMBULATORY_CARE_PROVIDER_SITE_OTHER): Payer: Medicaid Other | Admitting: Mental Health

## 2023-03-07 DIAGNOSIS — F322 Major depressive disorder, single episode, severe without psychotic features: Secondary | ICD-10-CM | POA: Diagnosis not present

## 2023-03-07 DIAGNOSIS — F411 Generalized anxiety disorder: Secondary | ICD-10-CM

## 2023-03-07 DIAGNOSIS — F4310 Post-traumatic stress disorder, unspecified: Secondary | ICD-10-CM

## 2023-03-07 NOTE — Progress Notes (Signed)
THERAPIST PROGRESS NOTE Virtual Visit via Video Note  I connected with Anita Hoover on 03/07/23 at  9:00 AM EDT by a video enabled telemedicine application and verified that I am speaking with the correct person using two identifiers.  Location: Patient: [facility] parking lot Provider: home office   I discussed the limitations of evaluation and management by telemedicine and the availability of in person appointments. The patient expressed understanding and agreed to proceed.  I discussed the assessment and treatment plan with the patient. The patient was provided an opportunity to ask questions and all were answered. The patient agreed with the plan and demonstrated an understanding of the instructions.   The patient was advised to call back or seek an in-person evaluation if the symptoms worsen or if the condition fails to improve as anticipated.  I provided 45 minutes of non-face-to-face time during this encounter.   Dorris Singh, [facility]   Session Time: 9:07am (45 minutes)  Participation Level: Active  Behavioral Response: CasualAlertDysphoric  Type of Therapy: Individual Therapy  Treatment Goals addressed: STG: "My anxiety and depression." Anita Hoover will increase management of depression AEB development of emotional regulation skills with engagement in enjoyable activities/self-care weekly within the next 6 months   ProgressTowards Goals: Progressing  Interventions: CBT and Supportive  Summary: Anita Hoover is a 27 y.o. female who presents with dx of PTSD, MDD severe and GAD for walk in tele-therapy appointment. Anita Hoover presents for session alert and oriented; mood and affect adequate. Speech clear and coherent at normal rate and tone. Shares sxs to have increased with increased low mood, anxiety with irritability. Shares to have ceased taking medication due to vomiting and decreased appetite. Shares current stressors to be concern for daughter to be on autism spectrum and  working to have her evaluated. Notes feelings of being overwhelmed. Shares to lack friendships and ability to get out of the house. Shares would like to work but concern for progress she has made with her duaghter. Shares thoughts of desire to go back to school and not feeling as if she has time for self and doing the things she would like to do. Processes with therapist Lonny Prude to be a good mother as well as being able to hold her own identity for self. Agrees to treatment plan. Denies current SI/HI Suicidal/Homicidal: Nowithout intent/plan  Therapist Response: Therapist engages Anita Hoover in tele-therapy session. Completed check in and assessed for current level of functioning, sxs management and level of stressors. Active empathic empathic listening; providing support and encouragement. Explored with pt recent events and factors that contribute to reported feelings of stress and overwhelmed. Supported in working to reframe from [employer] and catastrophizing thoughts. Explored medication management and provided education on benefits of medications and attendng appointments and advocating for self. Supported in processing thoughts and exploring ways to increase engagement in community and exploring identity outside of mother and partner roles. Encouraged exploration of community groups and exploring part-time employment. Reviewed session and provided follow up. No safety concerns reported.   Plan: Return again in  x 6 weeks.  Diagnosis: Severe major depression without psychotic features (HCC)  Post traumatic stress disorder  GAD (generalized anxiety disorder)  Collaboration of Care: Medication Management AEB referral to re-engage and Other None  Patient/Guardian was advised Release of Information must be obtained prior to any record release in order to collaborate their care with an outside provider. Patient/Guardian was advised if they have not already done so to contact the registration department  to sign  all necessary forms in order for [place] to release information regarding their care.   Consent: Patient/Guardian gives verbal consent for treatment and assignment of benefits for services provided during this visit. Patient/Guardian expressed understanding and agreed to proceed.   [name] Minister [place], [facility] 03/07/2023

## 2023-03-08 ENCOUNTER — Other Ambulatory Visit: Payer: Medicaid Other

## 2023-03-11 ENCOUNTER — Other Ambulatory Visit: Payer: Self-pay

## 2023-03-11 DIAGNOSIS — B379 Candidiasis, unspecified: Secondary | ICD-10-CM

## 2023-03-11 MED ORDER — FLUCONAZOLE 150 MG PO TABS
150.0000 mg | ORAL_TABLET | Freq: Once | ORAL | 0 refills | Status: AC
Start: 2023-03-11 — End: 2023-03-11

## 2023-03-27 ENCOUNTER — Encounter (HOSPITAL_COMMUNITY): Payer: Self-pay | Admitting: Physician Assistant

## 2023-03-27 ENCOUNTER — Ambulatory Visit (HOSPITAL_COMMUNITY): Payer: Medicaid Other | Admitting: Physician Assistant

## 2023-03-27 VITALS — BP 121/77 | HR 64 | Temp 98.0°F | Ht 65.5 in | Wt 142.4 lb

## 2023-03-27 DIAGNOSIS — F322 Major depressive disorder, single episode, severe without psychotic features: Secondary | ICD-10-CM

## 2023-03-27 DIAGNOSIS — F411 Generalized anxiety disorder: Secondary | ICD-10-CM | POA: Diagnosis not present

## 2023-03-27 DIAGNOSIS — F4310 Post-traumatic stress disorder, unspecified: Secondary | ICD-10-CM

## 2023-03-27 MED ORDER — BUSPIRONE HCL 7.5 MG PO TABS
7.5000 mg | ORAL_TABLET | Freq: Two times a day (BID) | ORAL | 1 refills | Status: DC
Start: 2023-03-27 — End: 2023-07-05
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
## 2023-04-04 ENCOUNTER — Telehealth (HOSPITAL_COMMUNITY): Payer: Self-pay | Admitting: Mental Health

## 2023-04-04 ENCOUNTER — Telehealth (HOSPITAL_COMMUNITY): Payer: Self-pay | Admitting: Physician Assistant

## 2023-04-04 NOTE — Telephone Encounter (Signed)
PT has tried to come twice this past week for walk in therapy and hasn't been able to get a slot. Today, I also had to turn her around. She left but called back very upset/crying and frustrated with how walk ins work. I explained to PT that the three people in front of her needed therapy slot. She understood but claims she was in a crisis and that's when I informed her to go downstairs to [facility] which she agreed to do.

## 2023-04-04 NOTE — Telephone Encounter (Signed)
PT came in twice this week for walk ins and had to turn her away. This morning, after she left, she called back very upset and crying stating she was in a crisis. I apologized for not being able to get in with walk ins due to spots already being taken but I told her about [facility] if she needed to go there. She agreed. I also sent a message to [place].

## 2023-04-18 ENCOUNTER — Other Ambulatory Visit (HOSPITAL_COMMUNITY)
Admission: RE | Admit: 2023-04-18 | Discharge: 2023-04-18 | Disposition: A | Discharge status: Home / Self Care | Payer: Medicaid Other | Source: Ambulatory Visit | Attending: Student | Admitting: Student

## 2023-04-18 ENCOUNTER — Ambulatory Visit: Payer: Medicaid Other | Admitting: Student

## 2023-04-18 ENCOUNTER — Encounter: Payer: Self-pay | Admitting: Student

## 2023-04-18 VITALS — BP 108/78 | HR 83 | Ht 65.0 in | Wt 142.0 lb

## 2023-04-18 DIAGNOSIS — Z113 Encounter for screening for infections with a predominantly sexual mode of transmission: Secondary | ICD-10-CM

## 2023-04-18 DIAGNOSIS — R102 Pelvic and perineal pain: Secondary | ICD-10-CM

## 2023-04-18 DIAGNOSIS — N926 Irregular menstruation, unspecified: Secondary | ICD-10-CM

## 2023-04-18 NOTE — Progress Notes (Signed)
Pt presents to discuss pelvic pain that has been intermittent for 1 year. Also has c/o reccurent BV. Reports irregular cycles for 2 years since birth of daughter. Has concerns with leaking milk from breast. Stopped breast feeding 1 year ago. Takes [place] daily.

## 2023-04-18 NOTE — Progress Notes (Signed)
History:  [name] is a 27 y.o. G1P1001 who presents to clinic today for intermittent pelvic pain, irregular periods, and STI screening. Patient reports that her cycles have not been normal since having a child in 2022. Notes that she will skip periods and have intermittent cycles that involve painful, heavy bleeding. Has been taking Slynd daily since 06/2022, but does mention that  that she occasionally misses pills or takes pills late. Prior to [facility] she was on COC and did not like how the pills made her feel. She also has been noticing intermittent pelvic/lower abdominal pain bilaterally for 1 year. States this feels like sharp, intense cramping. Pain is a 9/10 at its worst. Has not tried any OTC medications. Concerned for fibroids. Is currently sexually active and would also like to complete a vaginal swab only for STI screening. Is concerned due to on and off vaginal discharge that she has also experienced this year and does not consider her norm. Was treated earlier this year with boric acid suppositories.  Off-note, patient presented to office in July for milky breast discharge from both breasts that has continued. Her prolactin levels were normal at that time and has a bilaterally breast ultrasound scheduled for next week. Notices more drainage after getting out of the shower.  The following portions of the patient's history were reviewed and updated as appropriate: allergies, current medications, family history, past medical history, social history, past surgical history and problem list.  Review of Systems:  Review of Systems  Constitutional:  Negative for chills and fever.  Respiratory:  Negative for shortness of breath.   Cardiovascular:  Negative for chest pain.  Gastrointestinal:  Positive for abdominal pain. Negative for nausea and vomiting.  Genitourinary:  Negative for dysuria, frequency and urgency.  Neurological: Negative.   Psychiatric/Behavioral: Negative.         Objective:  Physical Exam BP 108/78   Pulse 83   Ht 5\' 5"  (1.651 m)   Wt 142 lb (64.4 kg)   LMP 03/18/2023   Breastfeeding No   BMI 23.63 kg/m  Physical Exam Constitutional:      Appearance: Normal appearance. She is normal weight.  Cardiovascular:     Rate and Rhythm: Normal rate.  Pulmonary:     Effort: Pulmonary effort is normal.  Abdominal:     General: Abdomen is flat.     Palpations: Abdomen is soft.     Tenderness: There is no abdominal tenderness.  Genitourinary:    Comments: Patient deferred Skin:    General: Skin is warm and dry.  Neurological:     Mental Status: She is alert and oriented to person, place, and time. Mental status is at baseline.  Psychiatric:        Mood and Affect: Mood normal.        Behavior: Behavior normal.    Labs and Imaging No results found for this or any previous visit (from the past 24 hour(s)).  No results found.  Health Maintenance Due  Topic Date Due   HPV VACCINES (1 - 3-dose series) Never done   INFLUENZA VACCINE  02/28/2023   COVID-19 Vaccine (1 - 2023-24 season) Never done    Labs, imaging and previous visits in Epic and Care Everywhere reviewed  Assessment & Plan:  1. 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 Irregular periods - encouraged to initiate routine with taking birth control daily and at same time daily.  Discussed the potential for missed pills to be influencing her cycles. Patient would like to avoid any additional hormones at this time. Discussed potential to trial Depo or IUD if she does not have desired result in 2-3 months.  - Discussed options to reassess endocrine function in 2-3 months as well if problem persists (was only partially worked up in July for breast discharge) - US PELVIC COMPLETE WITH TRANSVAGINAL; Future  3. Pelvic pain - Stable - US PELVIC COMPLETE WITH TRANSVAGINAL; Future  Approximately 25 minutes of  total time was spent with this patient on counseling and coordination of care  Return in about 3 months (around 07/18/2023), or if symptoms worsen or fail to improve.  Corlis Hove, NP 04/18/2023 5:20 PM

## 2023-04-22 LAB — CERVICOVAGINAL ANCILLARY ONLY
Bacterial Vaginitis (gardnerella): POSITIVE — AB
Candida Glabrata: NEGATIVE
Candida Vaginitis: NEGATIVE
Chlamydia: NEGATIVE
Comment: NEGATIVE
Comment: NEGATIVE
Comment: NEGATIVE
Comment: NEGATIVE
Comment: NEGATIVE
Comment: NORMAL
Neisseria Gonorrhea: NEGATIVE
Trichomonas: NEGATIVE

## 2023-04-25 ENCOUNTER — Ambulatory Visit (HOSPITAL_COMMUNITY): Payer: Medicaid Other | Admitting: Mental Health

## 2023-04-25 ENCOUNTER — Other Ambulatory Visit: Payer: Self-pay | Admitting: Student

## 2023-04-25 ENCOUNTER — Other Ambulatory Visit: Payer: Medicaid Other

## 2023-04-25 DIAGNOSIS — N760 Acute vaginitis: Secondary | ICD-10-CM

## 2023-04-25 MED ORDER — METRONIDAZOLE 500 MG PO TABS
500.0000 mg | ORAL_TABLET | Freq: Two times a day (BID) | ORAL | 0 refills | Status: DC
Start: 2023-04-25 — End: 2023-07-05

## 2023-04-26 ENCOUNTER — Ambulatory Visit (HOSPITAL_BASED_OUTPATIENT_CLINIC_OR_DEPARTMENT_OTHER): Admission: RE | Admit: 2023-04-26 | Payer: Medicaid Other | Source: Ambulatory Visit

## 2023-04-29 ENCOUNTER — Other Ambulatory Visit: Payer: Medicaid Other

## 2023-05-03 ENCOUNTER — Ambulatory Visit (HOSPITAL_BASED_OUTPATIENT_CLINIC_OR_DEPARTMENT_OTHER)
Admission: RE | Admit: 2023-05-03 | Discharge: 2023-05-03 | Disposition: A | Discharge status: Home / Self Care | Payer: Medicaid Other | Source: Ambulatory Visit | Attending: Student

## 2023-05-03 DIAGNOSIS — R102 Pelvic and perineal pain: Secondary | ICD-10-CM | POA: Diagnosis not present

## 2023-05-03 DIAGNOSIS — N926 Irregular menstruation, unspecified: Secondary | ICD-10-CM | POA: Diagnosis not present

## 2023-05-08 ENCOUNTER — Encounter (HOSPITAL_COMMUNITY): Payer: Medicaid Other | Admitting: Physician Assistant

## 2023-05-08 ENCOUNTER — Ambulatory Visit
Admission: RE | Admit: 2023-05-08 | Discharge: 2023-05-08 | Disposition: A | Discharge status: Home / Self Care | Payer: Medicaid Other | Source: Ambulatory Visit | Attending: Student

## 2023-05-08 ENCOUNTER — Ambulatory Visit
Admission: RE | Admit: 2023-05-08 | Discharge: 2023-05-08 | Disposition: A | Discharge status: Home / Self Care | Payer: Medicaid Other | Source: Ambulatory Visit | Attending: Student | Admitting: Student

## 2023-05-08 DIAGNOSIS — N6459 Other signs and symptoms in breast: Secondary | ICD-10-CM | POA: Diagnosis not present

## 2023-05-08 DIAGNOSIS — N6452 Nipple discharge: Secondary | ICD-10-CM

## 2023-06-11 ENCOUNTER — Other Ambulatory Visit: Payer: Self-pay

## 2023-06-11 DIAGNOSIS — B379 Candidiasis, unspecified: Secondary | ICD-10-CM

## 2023-06-11 MED ORDER — FLUCONAZOLE 150 MG PO TABS
150.0000 mg | ORAL_TABLET | Freq: Once | ORAL | 0 refills | Status: AC
Start: 2023-06-11 — End: 2023-06-11

## 2023-06-20 ENCOUNTER — Encounter (HOSPITAL_BASED_OUTPATIENT_CLINIC_OR_DEPARTMENT_OTHER): Payer: Self-pay | Admitting: Family Medicine

## 2023-07-05 ENCOUNTER — Ambulatory Visit (HOSPITAL_BASED_OUTPATIENT_CLINIC_OR_DEPARTMENT_OTHER): Payer: Medicaid Other | Admitting: Family Medicine

## 2023-07-05 ENCOUNTER — Encounter (HOSPITAL_BASED_OUTPATIENT_CLINIC_OR_DEPARTMENT_OTHER): Payer: Self-pay | Admitting: Family Medicine

## 2023-07-05 VITALS — BP 123/94 | HR 74 | Temp 98.6°F | Ht 65.5 in | Wt 142.5 lb

## 2023-07-05 DIAGNOSIS — H1132 Conjunctival hemorrhage, left eye: Secondary | ICD-10-CM | POA: Insufficient documentation

## 2023-07-05 DIAGNOSIS — H538 Other visual disturbances: Secondary | ICD-10-CM

## 2023-07-05 NOTE — Progress Notes (Signed)
Acute Care Office Visit  Subjective:   Anita Hoover 01/08/1996 07/05/2023  Chief Complaint  Patient presents with   Eye Problem    Woke up with a red spot on left eye. Not burning or hurting. Have a slight headache.    HPI: Patient reports waking up this morning with a "red spot" in her left eye. She denies drainage, itching, crusting, falls, accidents or trauma to head. Denies nasal congestion, sinus tenderness, cough, sneezing or fever/chills. She reports having a chronic headache due to need for dental work and states pain is located to right area of jaw. She denies vision changes but states she has noticed mild blurred distance vision for several months. She denies worsening of vision today. Denies pain or burning. Does not wear contacts or glasses.    The following portions of the patient's history were reviewed and updated as appropriate: past medical history, past surgical history, family history, social history, allergies, medications, and problem list.   Patient Active Problem List   Diagnosis Date Noted   Subconjunctival hemorrhage of left eye 07/05/2023   Abdominal bloating 11/20/2022   Severe major depression without psychotic features (HCC) 05/10/2022   Post traumatic stress disorder 05/10/2022   Anxiety and depression 04/25/2022   Hair loss 04/25/2022   Cold feeling 04/25/2022   Weight loss, unintentional 04/25/2022   Decreased appetite 04/25/2022   GAD (generalized anxiety disorder) 11/11/2019   Panic disorder 11/11/2019   Acne 01/12/2013    Past Surgical History:  Procedure Laterality Date   diagnositic renal exam Bilateral    related to hypertension as adolescent   Family History  Problem Relation Age of Onset   Depression Paternal Grandfather    Hypertension Mother    Depression Mother    Anxiety disorder Mother    Diabetes Brother    Williams syndrome Sister    Outpatient Medications Prior to Visit  Medication Sig Dispense Refill    Drospirenone (SLYND PO) Take 1 tablet by mouth daily.     ibuprofen (ADVIL) 800 MG tablet Take 800 mg by mouth every 8 (eight) hours as needed.     busPIRone (BUSPAR) 7.5 MG tablet Take 1 tablet (7.5 mg total) by mouth 2 (two) times daily. (Patient not taking: Reported on 07/05/2023) 60 tablet 1   Drospirenone (SLYND) 4 MG TABS Take 1 tablet by mouth daily. (Patient not taking: Reported on 07/05/2023) 28 tablet 12   metroNIDAZOLE (FLAGYL) 500 MG tablet Take 1 tablet (500 mg total) by mouth 2 (two) times daily. (Patient not taking: Reported on 07/05/2023) 14 tablet 0   No facility-administered medications prior to visit.   No Known Allergies   ROS: A complete ROS was performed with pertinent positives/negatives noted in the HPI. The remainder of the ROS are negative.    Objective:   Today's Vitals   07/05/23 1111  BP: (!) 123/94  Pulse: 74  Temp: 98.6 F (37 C)  TempSrc: Oral  SpO2: 100%  Weight: 142 lb 8 oz (64.6 kg)  Height: 5' 5.5" (1.664 m)    GENERAL: Well-appearing, in NAD. Well nourished.  SKIN: Pink, warm and dry. No rash, lesion, ulceration, or ecchymoses.  Head: Normocephalic. NECK: Trachea midline. Full ROM w/o pain or tenderness. No lymphadenopathy.  EARS: Tympanic membranes are intact, translucent without bulging and without drainage. Appropriate landmarks visualized.  EYES:  EOMI, PERRL, no drainage present. Small subconjunctival hemorrhage present to sclera of left eye. No drainage, crusting, or edema.  NOSE: Septum  midline w/o deformity. Nares patent, mucosa pink and non-inflamed w/o drainage. No sinus tenderness.  THROAT: Uvula midline. Oropharynx clear. Mucous membranes pink and moist.  RESPIRATORY: Chest wall symmetrical. Respirations even and non-labored.  EXTREMITIES: Without clubbing, cyanosis, or edema.  NEUROLOGIC: No motor or sensory deficits. Steady, even gait. C2-C12 intact.  PSYCH/MENTAL STATUS: Alert, oriented x 3. Cooperative, appropriate mood and  affect.   Vision Screening   Right eye Left eye Both eyes  Without correction 20/20 20/20 20/20   With correction         Assessment & Plan:  1. Subconjunctival hemorrhage of left eye 2. Blurred vision Mild subconjunctival hemorrhage of left eye likely to resolve on its own within 1-2 weeks. Discussed avoiding eye drops, drying OTC products. Vision changes present prior to hemorrhage and recommend follow up with optometrist or ophthalmologist locally for further vision screening.   - Ambulatory referral to Ophthalmology  Return if symptoms worsen or fail to improve.    Patient to reach out to office if new, worrisome, or unresolved symptoms arise or if no improvement in patient's condition. Patient verbalized understanding and is agreeable to treatment plan. All questions answered to patient's satisfaction.    Hilbert Bible, [place]

## 2023-07-09 ENCOUNTER — Other Ambulatory Visit: Payer: Self-pay | Admitting: *Deleted

## 2023-07-09 DIAGNOSIS — B3731 Acute candidiasis of vulva and vagina: Secondary | ICD-10-CM

## 2023-07-09 MED ORDER — FLUCONAZOLE 150 MG PO TABS: 150.0000 mg | ORAL_TABLET | Freq: Once | ORAL | 0 refills | Status: AC

## 2023-07-09 NOTE — Progress Notes (Signed)
RX Diflucan for symptoms of yeast infection per protocol.

## 2023-08-01 ENCOUNTER — Other Ambulatory Visit: Payer: Self-pay | Admitting: Obstetrics

## 2023-10-01 ENCOUNTER — Ambulatory Visit: Admitting: Family Medicine
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
## 2023-10-10 ENCOUNTER — Telehealth: Payer: Self-pay

## 2023-10-10 MED ORDER — FLUCONAZOLE 150 MG PO TABS: 150.0000 mg | ORAL_TABLET | Freq: Once | ORAL | 0 refills | Status: AC

## 2023-10-10 NOTE — Telephone Encounter (Signed)
 Returned call, pt reports vaginal itching with white discharge, requesting rx for yeast. Sent per protocol.

## 2023-10-29 ENCOUNTER — Encounter (HOSPITAL_BASED_OUTPATIENT_CLINIC_OR_DEPARTMENT_OTHER): Payer: Self-pay

## 2023-10-29 ENCOUNTER — Ambulatory Visit (HOSPITAL_BASED_OUTPATIENT_CLINIC_OR_DEPARTMENT_OTHER): Admitting: Family Medicine

## 2023-10-29 ENCOUNTER — Other Ambulatory Visit: Payer: Self-pay | Admitting: Family Medicine

## 2023-11-01 ENCOUNTER — Other Ambulatory Visit: Payer: Self-pay

## 2023-11-01 ENCOUNTER — Ambulatory Visit (HOSPITAL_BASED_OUTPATIENT_CLINIC_OR_DEPARTMENT_OTHER): Admitting: Family Medicine

## 2023-11-01 MED ORDER — SLYND 4 MG PO TABS: 1.0000 | ORAL_TABLET | Freq: Every day | ORAL | 0 refills | Status: DC

## 2023-11-26 ENCOUNTER — Encounter: Payer: Self-pay | Admitting: Advanced Practice Midwife

## 2023-11-26 ENCOUNTER — Ambulatory Visit: Admitting: Advanced Practice Midwife

## 2023-11-26 VITALS — BP 109/69 | HR 85 | Wt 144.2 lb

## 2023-11-26 DIAGNOSIS — Z01419 Encounter for gynecological examination (general) (routine) without abnormal findings: Secondary | ICD-10-CM

## 2023-11-26 DIAGNOSIS — Z3009 Encounter for other general counseling and advice on contraception: Secondary | ICD-10-CM | POA: Diagnosis not present

## 2023-11-26 DIAGNOSIS — Z3041 Encounter for surveillance of contraceptive pills: Secondary | ICD-10-CM | POA: Diagnosis not present

## 2023-11-26 MED ORDER — NORETHIN ACE-ETH ESTRAD-FE 1-20 MG-MCG PO TABS: 1.0000 | ORAL_TABLET | Freq: Every day | ORAL | 11 refills | Status: DC

## 2023-11-26 NOTE — Progress Notes (Signed)
 Needs refill on birth control. Not due for PAP Declined STD screening.   Pt experiencing irregular periods. Would like to discuss alternate birth control methods.

## 2023-11-26 NOTE — Progress Notes (Signed)
   Subjective:     Anita Hoover is a 28 y.o. female here at [facility] for a routine exam.  Current complaints: periods have been irregular the last few months on Slynd . She initially started Slynd  postpartum in 2022 and has continued it because it worked well.  In the last 3-4 months, she reports breakthrough bleeding, with 2 periods/month.   Personal and family health history reviewed: yes.  Do you have a primary care provider? yes Do you feel safe at home? yes  Flowsheet Row Office Visit from 07/05/2023 in [facility] at [facility] Total Score 0       Health Maintenance Due  Topic Date Due   COVID-19 Vaccine (1) Never done   HPV VACCINES (1 - 3-dose series) Never done     Risk factors for chronic health problems: Smoking: Alchohol/how much: Pt BMI: Body mass index is 23.63 kg/m.   Gynecologic History No LMP recorded. (Menstrual status: Irregular Periods). Contraception: oral progesterone-only contraceptive Last Pap: 07/05/22. Results were: normal Last mammogram: n/a.   Obstetric History OB History  Gravida Para Term Preterm AB Living  1 1 1  0 0 1  SAB IAB Ectopic Multiple Live Births  0 0 0 0 1    # Outcome Date GA Lbr Len/2nd Weight Sex Type Anes PTL Lv  1 Term 03/03/21 [redacted]w[redacted]d 06:52 / 00:25 6 lb 10.9 oz (3.03 kg) F Vag-Spont EPI  LIV     The following portions of the patient's history were reviewed and updated as appropriate: allergies, current medications, past family history, past medical history, past social history, past surgical history, and problem list.  Review of Systems Pertinent items noted in HPI and remainder of comprehensive ROS otherwise negative.    Objective:   Today's Vitals   11/26/23 1538  BP: 109/69  Pulse: 85  Weight: 144 lb 3.2 oz (65.4 kg)   Body mass index is 23.63 kg/m.  VS reviewed, nursing note reviewed,  Constitutional: well developed, well nourished, no distress HEENT:  normocephalic, thyroid  without enlargement or mass HEART: RRR, no murmurs rubs/gallops RESP: clear and equal to auscultation bilaterally in all lobes  Breast Exam: exam performed: right breast normal with ropelike tissue in iupper outer portion of breast, no skin or nipple changes or axillary nodes, left breast normal with ropelike tissue in iupper outer portion of breast, no skin or nipple changes or axillary nodes Abdomen: soft Neuro: alert and oriented x 3 Skin: warm, dry Psych: affect normal Pelvic exam:Deferred     Assessment/Plan:   1. Encounter for annual routine gynecological examination (Primary)   2. Encounter for counseling regarding contraception --Discussed pt contraceptive plans and reviewed contraceptive methods based on pt preferences and effectiveness.  Pt prefers to switch from POPs to OCPs.  No contraindications.  Reviewed low risk of DVT/PE with OCPs as compared to POPs. --Pt took Junel in the past without side effects - norethindrone-ethinyl estradiol-FE (JUNEL FE 1/20) 1-20 MG-MCG tablet; Take 1 tablet by mouth daily.  Dispense: 28 tablet; Refill: 11     Return in about 3 months (around 02/25/2024) for Gyn follow up for contraception.   Arlester Bence, CNM 5:14 PM

## 2023-12-17 ENCOUNTER — Ambulatory Visit (HOSPITAL_COMMUNITY)
Admission: EM | Admit: 2023-12-17 | Discharge: 2023-12-17 | Disposition: A | Discharge status: Home / Self Care | Attending: Psychiatry | Admitting: Psychiatry

## 2023-12-17 DIAGNOSIS — F411 Generalized anxiety disorder: Secondary | ICD-10-CM | POA: Insufficient documentation

## 2023-12-17 DIAGNOSIS — F4310 Post-traumatic stress disorder, unspecified: Secondary | ICD-10-CM | POA: Insufficient documentation

## 2023-12-17 DIAGNOSIS — F439 Reaction to severe stress, unspecified: Secondary | ICD-10-CM

## 2023-12-17 DIAGNOSIS — Z6379 Other stressful life events affecting family and household: Secondary | ICD-10-CM | POA: Insufficient documentation

## 2023-12-17 MED ORDER — HYDROXYZINE HCL 25 MG PO TABS: 25.0000 mg | ORAL_TABLET | Freq: Three times a day (TID) | ORAL | 0 refills | Status: AC | PRN

## 2023-12-17 MED ORDER — HYDROXYZINE HCL 25 MG PO TABS: 25.0000 mg | ORAL_TABLET | Freq: Three times a day (TID) | ORAL | 0 refills | Status: DC | PRN

## 2023-12-17 NOTE — ED Provider Notes (Signed)
 Behavioral Health Urgent Care Medical Screening Exam  Patient Name: Anita Hoover MRN: [id number] Date of Evaluation: 12/17/23 Chief Complaint:  Stress and Anxiety  Diagnosis:  Final diagnoses:  GAD (generalized anxiety disorder)  Stress at home    Benard Brackett, patient seen face to face by this provider, consulted with Dr. Docia Freeman; and chart reviewed prior to evaluation.  History of Present illness: Anita Hoover is a 28 y.o. female.   Patient presented to [facility] as a walk in unaccompanied expressing lots of stress and anxiety related to mothering a 84 year old child with special needs. Patient has a history significant for generalized anxiety disorder, PTSD, panic disorder and depression. Patient previously followed by [facility] outpatient clinic and was last seen for medication management by Noble Bateman, PA and Carmel Chimes for therapy services. Patient reports feeling as though medications were being thrown at her and Cymbalta  was causing weight loss. Last medication prescribed for anxiety was buspirone  and patient is unaware of how she responded or did not respond to medication. She is wanting to get back in therapy but with her child having multiple specialist appointments it is difficult to be seen in person. Patient is open to establishing with a telepsych provider.  Patient denies any thoughts of suicide, homicidal ideations or hopelessness.  Patient endorses that she feels overwhelmed and reported by her partner.  Reports that she is from [place]  and most of her family is from [place]  and here in [place] she only has her partner who is very supportive and reliable.  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  Endorses a long history of anxiety dating back at least 7 years ago. Endorses periods of frequently worrying and stressing over things in general.  Patient reports that she has taken hydroxyzine  before in the past and does not  recall any adverse side effects when taking medication.  On chart review unable to see any previous treatment with hydroxyzine .  She denies any history of any psychotic symptoms.  During evaluation Anita Hoover is sitting in no acute distress.  She is alert/oriented x 4; calm/cooperative; and mood congruent with affect.  She is speaking in a clear tone at moderate volume, and normal pace; with good eye contact.  Her thought process is coherent and relevant; There is no indication that she is currently responding to internal/external stimuli or experiencing delusional thought content; and she has denied suicidal/self-harm/homicidal ideation, psychosis, and paranoia.  Patient has remained calm however, tearful at times when speaking about 44 year old daughter. All questions were assessment and has answered questions appropriately.     At this time Anita Hoover is educated and verbalizes understanding of mental health resources and other crisis services in the community. She is instructed to call 911 and present to the nearest emergency room should she experience any suicidal/homicidal ideation, auditory/visual/hallucinations, or detrimental worsening of mental health condition.   Patient is able to contract for safety and has not had any suicidal or homicidal ideations.  She is wanting to reestablish with outpatient psychiatric treatment and would like something to help with anxiety exacerbations.  Provided information to establish with [facility]  Virtual provider platform which has been linked with [facility] patient services.      Flowsheet Row ED from 12/17/2023 in [facility] Office Visit from 03/27/2023 in [facility] ED from 12/12/2021 in [facility] Emergency Department at  [address]  C-SSRS RISK CATEGORY No Risk No Risk No Risk       Psychiatric Specialty Exam  Presentation  General Appearance:Appropriate for  Environment  Eye Contact:Good  Speech:Clear and Coherent  Speech Volume:Normal  Handedness:-- (Not assessed)   Mood and Affect  Mood:Anxious  Affect:Appropriate; Tearful   Thought Process  Thought Processes:Coherent  Descriptions of Associations:Intact  Orientation:Full (Time, Place and Person)  Thought Content:WDL  Diagnosis of Schizophrenia or Schizoaffective disorder in past: No data recorded  Hallucinations:None  Ideas of Reference:None  Suicidal Thoughts:No  Homicidal Thoughts:No   Sensorium  Memory:Immediate Good; Recent Good; Remote Good  Judgment:Good  Insight:Good   Executive Functions  Concentration:Good  Attention Span:Good  Recall:Good  Fund of Knowledge:Good  Language:Good   Psychomotor Activity  Psychomotor Activity:Normal   Assets  Assets:Communication Skills; Desire for Improvement; Financial Resources/Insurance; Housing; Resilience   Sleep  Sleep:Good  Number of hours: not assessed  Physical Exam: Physical Exam Constitutional:      General: She is not in acute distress.    Appearance: Normal appearance. She is not ill-appearing.  HENT:     Head: Normocephalic and atraumatic.     Nose: Nose normal.  Eyes:     Extraocular Movements: Extraocular movements intact.     Conjunctiva/sclera: Conjunctivae normal.     Pupils: Pupils are equal, round, and reactive to light.  Cardiovascular:     Rate and Rhythm: Normal rate and regular rhythm.  Pulmonary:     Effort: Pulmonary effort is normal.     Breath sounds: Normal breath sounds.  Musculoskeletal:     Cervical back: Normal range of motion and neck supple.  Skin:    General: Skin is warm and dry.  Neurological:     General: No focal deficit present.     Mental Status: She is alert and oriented to person, place, and time.    Review of Systems  Psychiatric/Behavioral:  Negative for hallucinations, memory loss, substance abuse and suicidal ideas. The patient is  nervous/anxious (Overwhelmed and stressed). The patient does not have insomnia.     Blood pressure (!) 127/91, pulse 65, temperature 98.1 F (36.7 C), temperature source Oral, resp. rate 16, SpO2 100%. There is no height or weight on file to calculate BMI.  Musculoskeletal: Strength & Muscle Tone: within normal limits Gait & Station: normal Patient leans: N/A   [facility] MSE Discharge Disposition for Follow up and Recommendations: Based on my evaluation the patient does not appear to have an emergency medical condition and can be discharged with resources and follow up care in outpatient services for Medication Management, Partial Hospitalization Program, and Individual Therapy - Hydroxyzine  25 to 50 mg up to 3 times daily as needed for anxiety and/or sleep. -Outpatient resource provided to establish with behavioral health services through [facility].  Cydney Draft, NP 12/17/2023, 10:30 AM

## 2023-12-17 NOTE — Discharge Instructions (Addendum)
 Contact [facility] see the attached brochure to establish with virtual mental health therapist and medication prescriber.  I have prescribed you hydroxyzine  to take as needed for anxiety.  Start with 25 mg up to 3 times daily however you may increase to 50 mg up to 3 times daily  If any worrisome thoughts develop such as suicidal or homicidal ideations develop return for evaluation. [facility] crisis center is available 24 hours a day 7 days a week.
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
## 2024-01-22 ENCOUNTER — Ambulatory Visit: Admitting: Family Medicine

## 2024-02-04 ENCOUNTER — Ambulatory Visit (INDEPENDENT_AMBULATORY_CARE_PROVIDER_SITE_OTHER): Admitting: Obstetrics and Gynecology

## 2024-02-04 VITALS — BP 124/89 | HR 71 | Ht 65.5 in | Wt 142.0 lb

## 2024-02-04 DIAGNOSIS — Z3009 Encounter for other general counseling and advice on contraception: Secondary | ICD-10-CM | POA: Diagnosis not present

## 2024-02-04 MED ORDER — TWIRLA 120-30 MCG/24HR TD PTWK: 1.0000 | MEDICATED_PATCH | TRANSDERMAL | 11 refills | Status: DC

## 2024-02-04 NOTE — Progress Notes (Signed)
 GYNECOLOGY VISIT  Patient name: Anita Hoover MRN [id number]  Date of birth: [birth year] Chief Complaint:   Contraception  History:  Anita Hoover is a 28 y.o. G1P1001 being seen today for irregular bleeding on oral birth control  Discussed the use of AI scribe software for clinical note transcription with the patient, who gave verbal consent to proceed.  History of Present Illness Anita Hoover is a 28 year old female who presents with irregular menstrual cycles and prolonged bleeding while on Junel birth control pills.  She has experienced irregular menstrual cycles since switching to Junel birth control pills in April, with cycles described as 'all over the place' and nearly four weeks of heavy bleeding. Bleeding began on the first of the month and continued until the sixth, despite not being on the last week of her pill pack.  Previously, she used Slynd  birth control pills, which regulated her periods. Before her pregnancy, she also used Junel and experienced nausea, which has recurred with the current use of Junel. Nausea occurs throughout the month, not just during her periods.  She experiences severe dysmenorrhea, particularly a couple of days before menstruation. She denies using any other forms of contraception such as patches, rings, or implants, and has not used any non-pill contraceptive methods.  She has a history of high blood pressure during pregnancy but reports no elevated blood pressure readings in the past three years and does not take any medication for blood pressure currently. She has a history of migraines but denies experiencing aura symptoms. There is no worsening of migraines with the use of Junel.  No history of blood clots, liver problems, breast cancer, heart attack, or stroke. She also denies tobacco use.      Past Surgical History:  Procedure Laterality Date   diagnositic renal exam Bilateral    related to hypertension as adolescent    The  following portions of the patient's history were reviewed and updated as appropriate: allergies, current medications, past family history, past medical history, past social history, past surgical history and problem list.   Health Maintenance:   Last pap     Component Value Date/Time   DIAGPAP  07/05/2022 1218    - Negative for intraepithelial lesion or malignancy (NILM)   DIAGPAP  08/11/2020 1135    - Negative for intraepithelial lesion or malignancy (NILM)   HPVHIGH Negative 08/11/2020 1135   ADEQPAP  07/05/2022 1218    Satisfactory for evaluation; transformation zone component PRESENT.   ADEQPAP Satisfactory for evaluation.  The absence of an 08/11/2020 1135   ADEQPAP  08/11/2020 1135    endocervical/transformation zone component is not uncommon in pregnant   ADEQPAP patients. 08/11/2020 1135    High Risk HPV: Positive  Adequacy:  Satisfactory for evaluation, transformation zone component PRESENT  Diagnosis:  Atypical squamous cells of undetermined significance (ASC-US )  Last mammogram: n/a   Review of Systems:  Pertinent items are noted in HPI. Comprehensive review of systems was otherwise negative.   Objective:  Physical Exam BP 124/89   Pulse 71   Ht 5' 5.5 (1.664 m)   Wt 142 lb (64.4 kg)   LMP 01/28/2024 (Exact Date)   BMI 23.27 kg/m    Physical Exam Vitals and nursing note reviewed.  Constitutional:      Appearance: Normal appearance.  HENT:     Head: Normocephalic and atraumatic.  Pulmonary:     Effort: Pulmonary effort is normal.  Skin:    General: Skin is warm and  dry.  Neurological:     General: No focal deficit present.     Mental Status: She is alert.  Psychiatric:        Mood and Affect: Mood normal.        Behavior: Behavior normal.        Thought Content: Thought content normal.        Judgment: Judgment normal.        Assessment & Plan:   Assessment & Plan Irregular Menstrual Bleeding Irregular cycles with prolonged heavy bleeding  on Junel suggest sensitivity to oral estrogen and inadequate hormonal control. - Prescribed contraceptive patch, apply weekly for three weeks on, one week off. Monitor bleeding patterns. - Advised monitoring for nausea and migraine exacerbation due to higher estrogen levels. - Discussed potential adhesive allergies, advised monitoring for skin reactions. - Conduct AUB labs if bleeding persists. - Consider alternative contraceptive methods if patch ineffective.  Nausea Nausea throughout the month on Junel may indicated sensitivity to oral estrogen. - Monitor nausea after starting contraceptive patch.  Dysmenorrhea Severe cramps before period associated with Junel use. - Monitor dysmenorrhea symptoms after starting contraceptive patch.  Migraine History of migraines without aura, no worsening with Junel. Monitor due to higher estrogen in patch. - Monitor for migraine exacerbation after starting contraceptive patch.  Follow-up Follow-up to assess contraceptive method effectiveness and symptoms. - Schedule follow-up in three months to evaluate contraceptive patch effectiveness and symptoms. - Advise return if symptoms persist or worsen.   Routine preventative health maintenance measures emphasized.  Carter Quarry, MD Minimally Invasive Gynecologic Surgery [facility] for [facility], [facility]

## 2024-02-04 NOTE — Progress Notes (Signed)
 On BCM since April 2025. Menses irregular since being on Junel FE, plus nausea and vomiting.

## 2024-02-20 ENCOUNTER — Telehealth: Payer: Self-pay

## 2024-02-20 NOTE — Telephone Encounter (Signed)
 Returned call, no answer, left vm

## 2024-03-20 ENCOUNTER — Other Ambulatory Visit: Payer: Self-pay

## 2024-03-20 MED ORDER — FLUCONAZOLE 150 MG PO TABS: 150.0000 mg | ORAL_TABLET | Freq: Once | ORAL | 0 refills | Status: AC

## 2024-04-20 ENCOUNTER — Other Ambulatory Visit: Payer: Self-pay

## 2024-04-20 MED ORDER — FLUCONAZOLE 150 MG PO TABS
150.0000 mg | ORAL_TABLET | Freq: Once | ORAL | 0 refills | Status: AC
Start: 2024-04-20 — End: 2024-04-20

## 2024-05-20 ENCOUNTER — Ambulatory Visit: Admitting: Physician Assistant

## 2024-06-15 ENCOUNTER — Ambulatory Visit: Admitting: Student

## 2024-06-19 ENCOUNTER — Other Ambulatory Visit: Payer: Self-pay

## 2024-06-19 MED ORDER — FLUCONAZOLE 150 MG PO TABS: 150.0000 mg | ORAL_TABLET | Freq: Once | ORAL | 0 refills | Status: AC

## 2024-06-29 ENCOUNTER — Ambulatory Visit: Admitting: Obstetrics

## 2024-06-29 ENCOUNTER — Encounter: Payer: Self-pay | Admitting: Obstetrics

## 2024-06-29 VITALS — BP 114/77 | HR 73 | Ht 65.5 in | Wt 144.9 lb

## 2024-06-29 DIAGNOSIS — F32A Depression, unspecified: Secondary | ICD-10-CM

## 2024-06-29 DIAGNOSIS — F419 Anxiety disorder, unspecified: Secondary | ICD-10-CM | POA: Diagnosis not present

## 2024-06-29 DIAGNOSIS — Z3009 Encounter for other general counseling and advice on contraception: Secondary | ICD-10-CM | POA: Diagnosis not present

## 2024-06-29 DIAGNOSIS — Z3202 Encounter for pregnancy test, result negative: Secondary | ICD-10-CM | POA: Diagnosis not present

## 2024-06-29 DIAGNOSIS — Z30011 Encounter for initial prescription of contraceptive pills: Secondary | ICD-10-CM

## 2024-06-29 LAB — POCT URINE PREGNANCY: Preg Test, Ur: NEGATIVE

## 2024-06-29 MED ORDER — SLYND 4 MG PO TABS: 1.0000 | ORAL_TABLET | Freq: Every day | ORAL | 11 refills | Status: AC

## 2024-06-29 NOTE — Progress Notes (Signed)
 Pt presents for bc pills. Pt was on the patch but would like to switch back to pills.pt declines pap and std testing due to heavy flow.  No other questions at this time.

## 2024-06-29 NOTE — Progress Notes (Signed)
 Subjective:    Anita Hoover is a 28 y.o. female who presents for contraception counseling. The patient has no complaints today. The patient is sexually active. Pertinent past medical history: none.  The information documented in the HPI was reviewed and verified.  Menstrual History: OB History     Gravida  1   Para  1   Term  1   Preterm  0   AB  0   Living  1      SAB  0   IAB  0   Ectopic  0   Multiple  0   Live Births  1            Patient's last menstrual period was 06/26/2024 (exact date).   Patient Active Problem List   Diagnosis Date Noted   Subconjunctival hemorrhage of left eye 07/05/2023   Abdominal bloating 11/20/2022   Severe major depression without psychotic features (HCC) 05/10/2022   Post traumatic stress disorder 05/10/2022   Anxiety and depression 04/25/2022   Hair loss 04/25/2022   Cold feeling 04/25/2022   Weight loss, unintentional 04/25/2022   Decreased appetite 04/25/2022   GAD (generalized anxiety disorder) 11/11/2019   Panic disorder 11/11/2019   Oral contraceptive use 06/09/2019   Acne 01/12/2013     Past Surgical History:  Procedure Laterality Date   diagnositic renal exam Bilateral    related to hypertension as adolescent     Current Outpatient Medications:    Drospirenone  (SLYND ) 4 MG TABS, Take 1 tablet (4 mg total) by mouth daily before breakfast., Disp: 28 tablet, Rfl: 11   hydrOXYzine  (ATARAX ) 25 MG tablet, Take 1-2 tablets (25-50 mg total) by mouth every 8 (eight) hours as needed for itching or anxiety. (Patient not taking: Reported on 06/29/2024), Disp: 60 tablet, Rfl: 0   ibuprofen  (ADVIL ) 800 MG tablet, Take 800 mg by mouth every 8 (eight) hours as needed. (Patient not taking: Reported on 06/29/2024), Disp: , Rfl:  No Known Allergies  Social History   Tobacco Use   Smoking status: Never    Passive exposure: Never   Smokeless tobacco: Never  Substance Use Topics   Alcohol use: Yes    Comment: rarely     Family History  Problem Relation Age of Onset   Depression Paternal Grandfather    Hypertension Mother    Depression Mother    Anxiety disorder Mother    Diabetes Brother    Williams syndrome Sister        Review of Systems Constitutional: negative for weight loss Genitourinary:negative for abnormal menstrual periods and vaginal discharge   Objective:   BP 114/77   Pulse 73   Ht 5' 5.5 (1.664 m)   Wt 144 lb 14.4 oz (65.7 kg)   LMP 06/26/2024 (Exact Date)   BMI 23.75 kg/m    General:   Alert and no distress  Skin:   no rash or abnormalities  Lungs:   clear to auscultation bilaterally  Heart:   regular rate and rhythm, S1, S2 normal, no murmur, click, rub or gallop  The remainder of the physical exam deferred due to the type of encounter.  Lab Review Urine pregnancy test Labs reviewed yes Radiologic studies reviewed no  I have spent a total of 15 minutes of face-to-face time, excluding clinical staff time, reviewing notes and preparing to see patient, ordering tests and/or medications, and counseling the patient.   Assessment:    28 y.o., starting oral progesterone-only contraceptive, no contraindications.  Plan:   1. Encounter for counseling regarding contraception (Primary)  2. Encounter for initial prescription of contraceptive pills Rx: - POCT urine pregnancy - Drospirenone  (SLYND ) 4 MG TABS; Take 1 tablet (4 mg total) by mouth daily before breakfast.  Dispense: 28 tablet; Refill: 11  3. Anxiety and depression - clinically stable.  Managed by PCP    All questions answered. Contraception: oral progesterone-only contraceptive. Discussed healthy lifestyle modifications. Follow up in 6 weeks. Pregnancy test, result: negative.   Meds ordered this encounter  Medications   Drospirenone  (SLYND ) 4 MG TABS    Sig: Take 1 tablet (4 mg total) by mouth daily before breakfast.    Dispense:  28 tablet    Refill:  11   Orders Placed This Encounter   Procedures   POCT urine pregnancy    CARLIN RONAL CENTERS, MD, FACOG Attending Obstetrician & Gynecologist, [facility] for [facility], [facility], [place] 06/29/2024

## 2024-07-06 ENCOUNTER — Ambulatory Visit: Admitting: Obstetrics and Gynecology

## 2024-07-13 ENCOUNTER — Ambulatory Visit: Admitting: Obstetrics
